# Patient Record
Sex: Female | Born: 1975 | Race: Black or African American | Hispanic: No | Marital: Single | Smoking: Current every day smoker
Health system: Southern US, Community
[De-identification: ages and names within clinical notes are randomized; demographics above are authoritative.]

## PROBLEM LIST (undated history)

## (undated) DIAGNOSIS — F410 Panic disorder [episodic paroxysmal anxiety] without agoraphobia: Secondary | ICD-10-CM

## (undated) DIAGNOSIS — I10 Essential (primary) hypertension: Secondary | ICD-10-CM

## (undated) DIAGNOSIS — F419 Anxiety disorder, unspecified: Secondary | ICD-10-CM

---

## 2007-08-26 ENCOUNTER — Inpatient Hospital Stay: Payer: Self-pay | Admitting: Psychiatry

## 2009-07-17 ENCOUNTER — Ambulatory Visit: Payer: Self-pay | Admitting: Internal Medicine

## 2009-07-18 ENCOUNTER — Ambulatory Visit: Payer: Self-pay | Admitting: Internal Medicine

## 2009-08-17 ENCOUNTER — Ambulatory Visit: Payer: Self-pay | Admitting: Internal Medicine

## 2010-04-04 ENCOUNTER — Emergency Department: Payer: Self-pay | Admitting: Emergency Medicine

## 2010-12-22 ENCOUNTER — Emergency Department: Payer: Self-pay | Admitting: Emergency Medicine

## 2012-12-27 ENCOUNTER — Inpatient Hospital Stay: Payer: Self-pay | Admitting: Psychiatry

## 2012-12-27 LAB — DRUG SCREEN, URINE
Amphetamines, Ur Screen: NEGATIVE (ref ?–1000)
Barbiturates, Ur Screen: NEGATIVE (ref ?–200)
Benzodiazepine, Ur Scrn: NEGATIVE (ref ?–200)
Cocaine Metabolite,Ur ~~LOC~~: NEGATIVE (ref ?–300)
MDMA (Ecstasy)Ur Screen: NEGATIVE (ref ?–500)
Opiate, Ur Screen: NEGATIVE (ref ?–300)
Tricyclic, Ur Screen: POSITIVE (ref ?–1000)

## 2012-12-27 LAB — CBC
HGB: 11.9 g/dL — ABNORMAL LOW (ref 12.0–16.0)
MCHC: 33.5 g/dL (ref 32.0–36.0)
Platelet: 200 10*3/uL (ref 150–440)
RBC: 3.85 10*6/uL (ref 3.80–5.20)
WBC: 6 10*3/uL (ref 3.6–11.0)

## 2012-12-27 LAB — TSH: Thyroid Stimulating Horm: 2.2 u[IU]/mL

## 2012-12-27 LAB — COMPREHENSIVE METABOLIC PANEL
Albumin: 3.8 g/dL (ref 3.4–5.0)
BUN: 15 mg/dL (ref 7–18)
Bilirubin,Total: 0.3 mg/dL (ref 0.2–1.0)
Calcium, Total: 8.8 mg/dL (ref 8.5–10.1)
Chloride: 109 mmol/L — ABNORMAL HIGH (ref 98–107)
Creatinine: 0.75 mg/dL (ref 0.60–1.30)
EGFR (Non-African Amer.): >60
Potassium: 3.6 mmol/L (ref 3.5–5.1)
SGPT (ALT): 17 U/L (ref 12–78)
Total Protein: 7.5 g/dL (ref 6.4–8.2)

## 2012-12-27 LAB — ETHANOL
Ethanol %: <0.003 % (ref 0.000–0.080)
Ethanol: <3 mg/dL

## 2012-12-27 LAB — ACETAMINOPHEN LEVEL: Acetaminophen: <2 ug/mL

## 2012-12-27 LAB — SALICYLATE LEVEL: Salicylates, Serum: 2 mg/dL

## 2012-12-28 LAB — BEHAVIORAL MEDICINE 1 PANEL
Albumin: 3.9 g/dL (ref 3.4–5.0)
Alkaline Phosphatase: 56 U/L (ref 50–136)
Anion Gap: 7 (ref 7–16)
BUN: 12 mg/dL (ref 7–18)
Basophil #: 0.1 10*3/uL (ref 0.0–0.1)
Basophil %: 1.1 %
Bilirubin,Total: 0.2 mg/dL (ref 0.2–1.0)
Calcium, Total: 9 mg/dL (ref 8.5–10.1)
Chloride: 105 mmol/L (ref 98–107)
Co2: 23 mmol/L (ref 21–32)
Creatinine: 0.86 mg/dL (ref 0.60–1.30)
Eosinophil #: 0.3 10*3/uL (ref 0.0–0.7)
Eosinophil %: 4.3 %
Glucose: 116 mg/dL — ABNORMAL HIGH (ref 65–99)
HCT: 37.8 % (ref 35.0–47.0)
HGB: 12.6 g/dL (ref 12.0–16.0)
Lymphocyte #: 3 10*3/uL (ref 1.0–3.6)
Lymphocyte %: 51.5 %
MCH: 30.5 pg (ref 26.0–34.0)
MCHC: 33.3 g/dL (ref 32.0–36.0)
MCV: 91 fL (ref 80–100)
Monocyte #: 0.4 x10 3/mm (ref 0.2–0.9)
Monocyte %: 6.4 %
Neutrophil #: 2.1 10*3/uL (ref 1.4–6.5)
Neutrophil %: 36.7 %
Osmolality: 271 (ref 275–301)
Platelet: 219 10*3/uL (ref 150–440)
Potassium: 3.8 mmol/L (ref 3.5–5.1)
RBC: 4.13 10*6/uL (ref 3.80–5.20)
RDW: 14.4 % (ref 11.5–14.5)
SGOT(AST): 16 U/L (ref 15–37)
SGPT (ALT): 23 U/L (ref 12–78)
Sodium: 135 mmol/L — ABNORMAL LOW (ref 136–145)
Thyroid Stimulating Horm: 5.47 u[IU]/mL — ABNORMAL HIGH
Total Protein: 8.2 g/dL (ref 6.4–8.2)
WBC: 5.9 10*3/uL (ref 3.6–11.0)

## 2012-12-30 LAB — URINALYSIS, COMPLETE
Glucose,UR: NEGATIVE mg/dL (ref 0–75)
Ketone: NEGATIVE
Nitrite: NEGATIVE
Protein: 100
RBC,UR: 2808 /HPF (ref 0–5)
Specific Gravity: 1.023 (ref 1.003–1.030)

## 2012-12-31 LAB — URINALYSIS, COMPLETE
Bacteria: NONE SEEN
Bilirubin,UR: NEGATIVE
Glucose,UR: NEGATIVE mg/dL (ref 0–75)
Ketone: NEGATIVE
Nitrite: NEGATIVE
Ph: 7 (ref 4.5–8.0)
Specific Gravity: 1.027 (ref 1.003–1.030)
Squamous Epithelial: 13

## 2013-05-09 ENCOUNTER — Encounter (HOSPITAL_COMMUNITY): Payer: Self-pay | Admitting: Emergency Medicine

## 2013-05-09 ENCOUNTER — Emergency Department (HOSPITAL_COMMUNITY)
Admission: EM | Admit: 2013-05-09 | Discharge: 2013-05-10 | Disposition: A | Discharge status: Home / Self Care | Payer: Medicaid Other | Attending: Emergency Medicine | Admitting: Emergency Medicine

## 2013-05-09 DIAGNOSIS — IMO0002 Reserved for concepts with insufficient information to code with codable children: Secondary | ICD-10-CM

## 2013-05-09 DIAGNOSIS — S3994XA Unspecified injury of external genitals, initial encounter: Secondary | ICD-10-CM | POA: Insufficient documentation

## 2013-05-09 DIAGNOSIS — F17200 Nicotine dependence, unspecified, uncomplicated: Secondary | ICD-10-CM | POA: Insufficient documentation

## 2013-05-09 DIAGNOSIS — S39848A Other specified injuries of external genitals, initial encounter: Secondary | ICD-10-CM | POA: Insufficient documentation

## 2013-05-09 DIAGNOSIS — I10 Essential (primary) hypertension: Secondary | ICD-10-CM | POA: Insufficient documentation

## 2013-05-09 DIAGNOSIS — Z8659 Personal history of other mental and behavioral disorders: Secondary | ICD-10-CM | POA: Insufficient documentation

## 2013-05-09 DIAGNOSIS — Z880 Allergy status to penicillin: Secondary | ICD-10-CM | POA: Insufficient documentation

## 2013-05-09 DIAGNOSIS — Z3202 Encounter for pregnancy test, result negative: Secondary | ICD-10-CM | POA: Insufficient documentation

## 2013-05-09 DIAGNOSIS — T7421XA Adult sexual abuse, confirmed, initial encounter: Secondary | ICD-10-CM | POA: Insufficient documentation

## 2013-05-09 HISTORY — DX: Anxiety disorder, unspecified: F41.9

## 2013-05-09 HISTORY — DX: Panic disorder (episodic paroxysmal anxiety): F41.0

## 2013-05-09 HISTORY — DX: Essential (primary) hypertension: I10
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
## 2013-05-09 NOTE — ED Notes (Signed)
Patient requesting to use the restroom. RR2 commode cleaned with clorox wipes and allowed to dry. Bedpan placed in commode. Patient instructed to void and place toilet tissue in bedpan with urine. After patient voided, bathroom door closed and no entry allowed until specimen collected by SANE RN.

## 2013-05-09 NOTE — ED Notes (Signed)
SANE nurse Lindsey at bedside.  
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
## 2013-05-10 MED ORDER — PROMETHAZINE HCL 25 MG PO TABS
25.0000 mg | ORAL_TABLET | Freq: Once | ORAL | Status: AC
  Administered 2013-05-10: 75 mg via ORAL

## 2013-05-10 MED ORDER — AZITHROMYCIN 250 MG PO TABS: 1000.0000 mg | ORAL_TABLET | Freq: Once | ORAL | Status: DC

## 2013-05-10 MED ORDER — METRONIDAZOLE 500 MG PO TABS
2000.0000 mg | ORAL_TABLET | Freq: Once | ORAL | Status: AC
  Administered 2013-05-10: 2000 mg via ORAL

## 2013-05-10 MED ORDER — CEFIXIME 400 MG PO TABS
400.0000 mg | ORAL_TABLET | Freq: Once | ORAL | Status: AC
  Administered 2013-05-10: 400 mg via ORAL

## 2013-05-10 MED ORDER — LEVONORGESTREL 1.5 MG PO TABS
1.5000 mg | ORAL_TABLET | Freq: Once | ORAL | Status: AC
  Administered 2013-05-10: 1.5 mg via ORAL
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
## 2013-05-10 NOTE — ED Provider Notes (Signed)
Medical screening examination/treatment/procedure(s) were performed by non-physician practitioner and as supervising physician I was immediately available for consultation/collaboration.  Derwood Kaplan, MD 05/10/13 [phone]

## 2013-05-13 ENCOUNTER — Encounter: Payer: Self-pay | Admitting: *Deleted

## 2013-05-24 ENCOUNTER — Encounter: Payer: Medicaid Other | Admitting: Family Medicine

## 2014-01-20 ENCOUNTER — Inpatient Hospital Stay: Payer: Self-pay | Admitting: Psychiatry

## 2014-01-20 LAB — URINALYSIS, COMPLETE
BACTERIA: NONE SEEN
BLOOD: NEGATIVE
Bilirubin,UR: NEGATIVE
Glucose,UR: NEGATIVE mg/dL (ref 0–75)
Ketone: NEGATIVE
LEUKOCYTE ESTERASE: NEGATIVE
Nitrite: NEGATIVE
Ph: 5 (ref 4.5–8.0)
Protein: NEGATIVE
RBC,UR: <1 /HPF (ref 0–5)
Specific Gravity: 1.029 (ref 1.003–1.030)
Squamous Epithelial: 2

## 2014-01-20 LAB — CBC
HCT: 37.9 % (ref 35.0–47.0)
HGB: 12.3 g/dL (ref 12.0–16.0)
MCH: 30.2 pg (ref 26.0–34.0)
MCHC: 32.5 g/dL (ref 32.0–36.0)
MCV: 93 fL (ref 80–100)
PLATELETS: 234 10*3/uL (ref 150–440)
RBC: 4.07 10*6/uL (ref 3.80–5.20)
RDW: 14.8 % — ABNORMAL HIGH (ref 11.5–14.5)
WBC: 5.5 10*3/uL (ref 3.6–11.0)

## 2014-01-20 LAB — DRUG SCREEN, URINE
Amphetamines, Ur Screen: NEGATIVE (ref ?–1000)
Barbiturates, Ur Screen: NEGATIVE (ref ?–200)
Benzodiazepine, Ur Scrn: NEGATIVE (ref ?–200)
CANNABINOID 50 NG, UR ~~LOC~~: NEGATIVE (ref ?–50)
COCAINE METABOLITE, UR ~~LOC~~: NEGATIVE (ref ?–300)
MDMA (Ecstasy)Ur Screen: NEGATIVE (ref ?–500)
Methadone, Ur Screen: NEGATIVE (ref ?–300)
OPIATE, UR SCREEN: NEGATIVE (ref ?–300)
PHENCYCLIDINE (PCP) UR S: NEGATIVE (ref ?–25)
Tricyclic, Ur Screen: POSITIVE (ref ?–1000)

## 2014-01-20 LAB — COMPREHENSIVE METABOLIC PANEL
Albumin: 3.9 g/dL (ref 3.4–5.0)
Alkaline Phosphatase: 43 U/L — ABNORMAL LOW
Anion Gap: 8 (ref 7–16)
BILIRUBIN TOTAL: 0.3 mg/dL (ref 0.2–1.0)
BUN: 13 mg/dL (ref 7–18)
CREATININE: 0.71 mg/dL (ref 0.60–1.30)
Calcium, Total: 9.1 mg/dL (ref 8.5–10.1)
Chloride: 106 mmol/L (ref 98–107)
Co2: 23 mmol/L (ref 21–32)
EGFR (African American): >60
GLUCOSE: 88 mg/dL (ref 65–99)
OSMOLALITY: 273 (ref 275–301)
POTASSIUM: 3.6 mmol/L (ref 3.5–5.1)
SGOT(AST): 10 U/L — ABNORMAL LOW (ref 15–37)
SGPT (ALT): 15 U/L (ref 12–78)
Sodium: 137 mmol/L (ref 136–145)
TOTAL PROTEIN: 7.9 g/dL (ref 6.4–8.2)

## 2014-01-20 LAB — ACETAMINOPHEN LEVEL

## 2014-01-20 LAB — SALICYLATE LEVEL: Salicylates, Serum: 2.4 mg/dL

## 2014-01-20 LAB — ETHANOL

## 2014-07-31 ENCOUNTER — Emergency Department: Payer: Self-pay | Admitting: Emergency Medicine

## 2014-07-31 LAB — COMPREHENSIVE METABOLIC PANEL
Albumin: 4.3 g/dL (ref 3.4–5.0)
Alkaline Phosphatase: 42 U/L — ABNORMAL LOW
Anion Gap: 7 (ref 7–16)
BUN: 9 mg/dL (ref 7–18)
Bilirubin,Total: 0.3 mg/dL (ref 0.2–1.0)
CO2: 25 mmol/L (ref 21–32)
Calcium, Total: 9 mg/dL (ref 8.5–10.1)
Chloride: 103 mmol/L (ref 98–107)
Creatinine: 0.81 mg/dL (ref 0.60–1.30)
EGFR (African American): >60
GLUCOSE: 105 mg/dL — AB (ref 65–99)
Osmolality: 269 (ref 275–301)
Potassium: 3.8 mmol/L (ref 3.5–5.1)
SGOT(AST): 13 U/L — ABNORMAL LOW (ref 15–37)
SGPT (ALT): 21 U/L
SODIUM: 135 mmol/L — AB (ref 136–145)
Total Protein: 8.1 g/dL (ref 6.4–8.2)

## 2014-07-31 LAB — URINALYSIS, COMPLETE
BILIRUBIN, UR: NEGATIVE
Bacteria: NONE SEEN
Blood: NEGATIVE
GLUCOSE, UR: NEGATIVE mg/dL (ref 0–75)
Ketone: NEGATIVE
Leukocyte Esterase: NEGATIVE
NITRITE: NEGATIVE
Ph: 8 (ref 4.5–8.0)
Protein: NEGATIVE
RBC, UR: NONE SEEN /HPF (ref 0–5)
Specific Gravity: 1.009 (ref 1.003–1.030)

## 2014-07-31 LAB — DRUG SCREEN, URINE

## 2014-07-31 LAB — PREGNANCY, URINE: PREGNANCY TEST, URINE: NEGATIVE m[IU]/mL

## 2014-07-31 LAB — CBC
HCT: 40.1 % (ref 35.0–47.0)
HGB: 12.8 g/dL (ref 12.0–16.0)
MCH: 30.7 pg (ref 26.0–34.0)
MCHC: 32 g/dL (ref 32.0–36.0)
MCV: 96 fL (ref 80–100)
Platelet: 251 10*3/uL (ref 150–440)
RBC: 4.18 10*6/uL (ref 3.80–5.20)
RDW: 15.4 % — ABNORMAL HIGH (ref 11.5–14.5)
WBC: 6 10*3/uL (ref 3.6–11.0)

## 2014-07-31 LAB — ACETAMINOPHEN LEVEL: Acetaminophen: <2 ug/mL

## 2014-07-31 LAB — ETHANOL: Ethanol: <3 mg/dL

## 2014-07-31 LAB — SALICYLATE LEVEL

## 2014-07-31 LAB — VALPROIC ACID LEVEL: VALPROIC ACID: 3 ug/mL — AB

## 2014-08-29 LAB — URINALYSIS, COMPLETE
BILIRUBIN, UR: NEGATIVE
BLOOD: NEGATIVE
GLUCOSE, UR: NEGATIVE mg/dL (ref 0–75)
Leukocyte Esterase: NEGATIVE
Nitrite: NEGATIVE
Ph: 5 (ref 4.5–8.0)
Protein: NEGATIVE
RBC,UR: 1 /HPF (ref 0–5)
SPECIFIC GRAVITY: 1.024 (ref 1.003–1.030)
Squamous Epithelial: 4

## 2014-08-29 LAB — CBC WITH DIFFERENTIAL/PLATELET
BASOS ABS: 0.1 10*3/uL (ref 0.0–0.1)
Basophil %: 1.2 %
EOS PCT: 0.5 %
Eosinophil #: 0 10*3/uL (ref 0.0–0.7)
HCT: 40.5 % (ref 35.0–47.0)
HGB: 12.9 g/dL (ref 12.0–16.0)
LYMPHS PCT: 39.8 %
Lymphocyte #: 2.3 10*3/uL (ref 1.0–3.6)
MCH: 30.6 pg (ref 26.0–34.0)
MCHC: 32 g/dL (ref 32.0–36.0)
MCV: 96 fL (ref 80–100)
Monocyte #: 0.4 x10 3/mm (ref 0.2–0.9)
Monocyte %: 6.8 %
NEUTROS ABS: 3 10*3/uL (ref 1.4–6.5)
Neutrophil %: 51.7 %
Platelet: 252 10*3/uL (ref 150–440)
RBC: 4.23 10*6/uL (ref 3.80–5.20)
RDW: 14.4 % (ref 11.5–14.5)
WBC: 5.8 10*3/uL (ref 3.6–11.0)

## 2014-08-29 LAB — COMPREHENSIVE METABOLIC PANEL
ALBUMIN: 4 g/dL (ref 3.4–5.0)
AST: 25 U/L (ref 15–37)
Alkaline Phosphatase: 47 U/L
Anion Gap: 8 (ref 7–16)
BUN: 14 mg/dL (ref 7–18)
Bilirubin,Total: 0.2 mg/dL (ref 0.2–1.0)
CALCIUM: 9.1 mg/dL (ref 8.5–10.1)
Chloride: 106 mmol/L (ref 98–107)
Co2: 27 mmol/L (ref 21–32)
Creatinine: 0.78 mg/dL (ref 0.60–1.30)
EGFR (Non-African Amer.): >60
GLUCOSE: 77 mg/dL (ref 65–99)
OSMOLALITY: 281 (ref 275–301)
Potassium: 4 mmol/L (ref 3.5–5.1)
SGPT (ALT): 27 U/L
Sodium: 141 mmol/L (ref 136–145)
Total Protein: 8 g/dL (ref 6.4–8.2)

## 2014-08-29 LAB — DRUG SCREEN, URINE

## 2014-08-29 LAB — VALPROIC ACID LEVEL: Valproic Acid: <3 ug/mL — ABNORMAL LOW

## 2014-08-29 LAB — TROPONIN I: Troponin-I: <0.02 ng/mL

## 2014-08-29 LAB — ACETAMINOPHEN LEVEL

## 2014-08-29 LAB — SALICYLATE LEVEL

## 2014-08-30 ENCOUNTER — Inpatient Hospital Stay: Payer: Self-pay | Admitting: Psychiatry

## 2014-08-30 LAB — ETHANOL: Ethanol: <3 mg/dL

## 2014-09-12 ENCOUNTER — Emergency Department: Payer: Self-pay | Admitting: Emergency Medicine

## 2014-09-12 LAB — CBC
HCT: 37.4 % (ref 35.0–47.0)
HGB: 12.1 g/dL (ref 12.0–16.0)
MCH: 30 pg (ref 26.0–34.0)
MCHC: 32.4 g/dL (ref 32.0–36.0)
MCV: 93 fL (ref 80–100)
Platelet: 206 10*3/uL (ref 150–440)
RBC: 4.04 10*6/uL (ref 3.80–5.20)
RDW: 13.8 % (ref 11.5–14.5)
WBC: 6.6 10*3/uL (ref 3.6–11.0)

## 2014-09-13 LAB — COMPREHENSIVE METABOLIC PANEL
ANION GAP: 7 (ref 7–16)
AST: 24 U/L (ref 15–37)
Albumin: 3.9 g/dL (ref 3.4–5.0)
Alkaline Phosphatase: 41 U/L — ABNORMAL LOW (ref 46–116)
BUN: 10 mg/dL (ref 7–18)
Bilirubin,Total: 0.3 mg/dL (ref 0.2–1.0)
CALCIUM: 8.8 mg/dL (ref 8.5–10.1)
CHLORIDE: 105 mmol/L (ref 98–107)
CO2: 27 mmol/L (ref 21–32)
Creatinine: 0.81 mg/dL (ref 0.60–1.30)
Glucose: 86 mg/dL (ref 65–99)
OSMOLALITY: 276 (ref 275–301)
POTASSIUM: 3.7 mmol/L (ref 3.5–5.1)
SGPT (ALT): 22 U/L (ref 14–63)
SODIUM: 139 mmol/L (ref 136–145)
Total Protein: 7.8 g/dL (ref 6.4–8.2)

## 2014-09-13 LAB — DRUG SCREEN, URINE
Amphetamines, Ur Screen: NEGATIVE
Barbiturates, Ur Screen: NEGATIVE
Benzodiazepine, Ur Scrn: NEGATIVE
Cannabinoid 50 Ng, Ur ~~LOC~~: NEGATIVE
Cocaine Metabolite,Ur ~~LOC~~: NEGATIVE
MDMA (Ecstasy)Ur Screen: NEGATIVE
Methadone, Ur Screen: NEGATIVE
Opiate, Ur Screen: NEGATIVE
Phencyclidine (PCP) Ur S: NEGATIVE
Tricyclic, Ur Screen: POSITIVE

## 2014-09-13 LAB — ACETAMINOPHEN LEVEL: Acetaminophen: <2 ug/mL

## 2014-09-13 LAB — SALICYLATE LEVEL: Salicylates, Serum: <1.7 mg/dL

## 2014-09-13 LAB — ETHANOL: Ethanol: <3 mg/dL
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
## 2014-12-07 NOTE — Discharge Summary (Signed)
PATIENT NAME:  [name], [name] MR#:  [id number] DATE OF BIRTH:  1976-03-21  DATE OF ADMISSION:  12/27/2012 DATE OF DISCHARGE:  12/31/2012  HOSPITAL COURSE: See dictated history and physical for details of admission. This 39 year old woman with a history of chronic mental illness was admitted to the hospital with a worsening of mood and behavior in the context of alcohol abuse. The patient did not require detox. She was cooperative and pleasant on the ward. Some minor changes were made to her psychiatric medication, but overall she remained euthymic with a general improvement in her mood. She was cooperative on the unit and did not display any dangerous behavior. She denied suicidal ideation. She was able to engage appropriately in treatment on the unit. She was very interested in returning to her group home and continuing to follow up with [facility]. She was discharged on May 17th on Saturday when her group home could pick her up. She was agreeable to the plan for continued outpatient treatment in the community, including [facility] and [facility].   LABORATORY RESULTS: The chemistry panel showed a chloride slightly elevated at 109, otherwise unremarkable. Alcohol undetected. Hemoglobin slightly low at 11.9, otherwise normal CBC. TSH normal at 2.2. Drug screen positive for tricyclics, probably related to her medicine. Follow-up chemistries and CBC are nothing remarkable. Urinalysis, however, did show positive signs of an infection.   DISCHARGE MEDICATIONS: Trazodone 150 mg at night, Lexapro 30 mg per day, lovastatin 10 mg per day, Seroquel 300 mg, 2 of the tablets at night, iron sulfate 325 mg a day, omeprazole 40 mg a day.   MENTAL STATUS EXAM AT DISCHARGE: A neatly dressed and groomed woman, looks her stated age, cooperative with the interview. Eye contact good. Psychomotor activity normal. Speech somewhat slow but otherwise normal. Affect euthymic.  Mood stated as being fine. Denies suicidal or homicidal ideation. Shows improved judgment and insight. Denies any hallucinations. Probably low average intelligence.   DIAGNOSIS, PRINCIPAL AND PRIMARY:  AXIS I:  Schizophrenia, undifferentiated.    SECONDARY DIAGNOSES: AXIS I: Alcohol dependence.   AXIS II: Mild mental retardation.   AXIS III: Some scrapes to her forearm, self-inflicted but healing up.   AXIS IV: Severe chronic stress from burden of illness.   AXIS V: Functioning at time of discharge 60.   ____________________________ Audery AmelJohn T. Clapacs, MD jtc:cb D: 01/09/2013 22:14:59 ET T: 01/09/2013 22:38:55 ET JOB#: [id number]  cc: Audery AmelJohn T. Clapacs, MD, <Dictator> Audery AmelJOHN T CLAPACS MD ELECTRONICALLY SIGNED 01/10/2013 9:26

## 2014-12-07 NOTE — Consult Note (Signed)
PATIENT NAME:  [name], [name] MR#:  [id number] DATE OF BIRTH:  [birth year]  DATE OF ADMISSION:  12/26/2012  REQUESTING PHYSICIAN:  Bayard Males, MD  CONSULTING PHYSICIAN:  Ardeen Fillers. Garnetta Buddy, MD  REASON FOR ADMISSION:  "I let myself down."   HISTORY OF PRESENT ILLNESS: The patient is a 39 year old single African American female who is currently a resident of [facility] presented to the ED by the EMS, as she tried to cut herself and had self-inflicted abrasions and cuts on her left arm. The patient reported that she let herself down as she was feeling hopeless and wanted attention.  She reported that she never got to see her aunt before she was buried. She states that they were not taking her from her group home to see her aunt. She thinks about herself a lot. She reported that her aunt passed away a year ago and the group home was not allowing her to see her aunt. Reported that the group home was serving other clients and they were not allowing her to use the ice cream from the freezer as there was a lock on it.  She became very frustrated, down and depressed, and then she tried to cut herself on her left arm with a razor, and there were superficial abrasions noted on her left arm.   During my interview, the patient was noted to be sitting on the bed. She was down and depressed and her speech was soft. She reported that she does not like going over there and she is going to talk to her guardian about the same. Reported that she takes her medications but seems that they are not helping at this time. Reported that she feels very lonely over there. She reported that she has previous history of cutting in the past when she did the same thing approximately 8 days ago. The patient reported that she is unable to contract for safety at this time.   PAST PSYCHIATRIC HISTORY: The patient reported that she has previous history of psychiatric hospitalization when she was admitted to the behavioral health unit  in the past. She stated that she feels that the medications were not helping at that time also, and then she took an overdose of Xanax in a suicide attempt. She took the pills in the evening and woke up drowsy and was disoriented. At that time, she wanted to die and reported that her depression was precipitated by the number of stressors including relationship problems with her mother. Her mother was the payee at that time. She reported that she has long history of depression, PTSD, panic attacks.  SUBSTANCE ABUSE HISTORY:  The patient denied using any drugs or alcohol at this time.   PAST MEDICAL HISTORY: The patient does not have any acute medical issues except for GERD.   ALLERGIES:  PENICILLIN.   SOCIAL HISTORY:  The patient stated that she is currently living at the group home. She has 2 children, who are currently living with her grandmother. The patient was adopted, but then she grew up with other siblings. The patient reported that her mother handles her financial and business matters.   FAMILY HISTORY:  There is a strong family history of using drugs and alcohol.   MENTAL STATUS EXAMINATION:  The patient is a moderately built female who was sitting in the bed. She was calm and cooperative. There were abrasions noted on her left hand. Her speech was low in tone and volume. Mood was depressed and anxious.  Affect was congruent. Thought process was logical, goal-directed. Thought content was nondelusional. She reported having suicidal thoughts. She has some impulsive behavior. She was unable to contract for safety. Her insight and judgment very poor.  VITAL SIGNS:  Temperature 98.2, pulse 98, respirations 18, blood pressure 105/51.  LABORATORY DATA:  Glucose 86, BUN 15, creatinine 0.75, sodium 140, potassium 3.6, chloride 109, bicarbonate 27, anion gap 404, osmolality 280, calcium 8.8.  Blood alcohol level less than 3. Protein 7.5, albumin 3.8, bilirubin 0.3 alkaline phosphatase 50, AST 19, ALT  17, TSH 2.2.  Urine drug screen positive for tricyclic antidepressants. WBC 6, RBC 3.85, hemoglobin 11.9, hematocrit 35.3, platelet count 200, MCH 33.5.  REVIEW OF SYSTEMS:  CONSTITUTIONAL:  Denied any weight loss.  EYES:  No blurring of vision.  ENT:  No hearing loss. CHEST:  Denied having any chest pain or palpitations.  RESPIRATORY:  No cough or wheezing. NEUROLOGIC:  No musculoskeletal pain.   DIAGNOSTIC IMPRESSION: AXIS I:  Major depressive disorder, history of posttraumatic stress disorder.   TREATMENT PLAN: Pt will be admitted to inpt BH unit on IVC  The patient will be restarted back on her medications, including: 1.  Seroquel XR 200 mg p.o. at bedtime.  2.  Tegretol 200 mg in the morning.  3.  Trazodone at bedtime to help her sleep.  Treatment team will continue to follow and will adjust her medications. She will be monitored closely in the behavioral health unit.  Thank you for allowing me to participate in the care of this patient.     ____________________________ Ardeen FillersUzma S. Garnetta BuddyFaheem, MD usf:ce D: 12/27/2012 16:13:23 ET T: 12/27/2012 17:23:16 ET JOB#: [id number]  cc: Ardeen FillersUzma S. Garnetta BuddyFaheem, MD, <Dictator> Rhunette CroftUZMA S Jalexa Pifer MD ELECTRONICALLY SIGNED 12/29/2012 13:37

## 2014-12-08 NOTE — Consult Note (Signed)
Salinas NAME:  Salinas, [name] DATE OF BIRTH:  29-Dec-1975  DATE OF CONSULTATION:  07/31/2014  REFERRING PHYSICIAN:   CONSULTING PHYSICIAN:  Audery Amel, MD  IDENTIFYING INFORMATION AND REASON FOR CONSULT:  A 39 year old woman with a history of schizophrenia, presented voluntarily.   CHIEF COMPLAINT: "I can't stand [facility]."   HISTORY OF PRESENT ILLNESS: Information obtained from [name] and [facility] chart. [name] describes some escalating frustration she has been having with Krystal way they treat her at [facility]. She describes multiple minor complaints of people at [facility] speaking to her rudely and not treating her respectfully. She says that other people at [facility] stare at her out of their psychosis. She feels uncomfortable there. On top of this it sounds like a man she had been seeing broke up with her last night which upset her. Also she had had a day visit to a relative's house and had tried to stay there overnight, which was not Krystal agreement and she got frustrated with that. [name] tells me that her mood has been bad for a while. Denies any sleep or appetite problems. She does not describe any clear auditory hallucinations. She is a little unreliable on this point, initially saying she did not hear voices at all, later saying that she heard voices telling her to kill herself, and then later again readmitting that she did not hear active hallucinations. She denies that she has any intention to harm or try to kill herself. She has a plan laid out for Krystal future and positive goals for herself. She has long been frustrated with her living situation. She says that she is compliant with therapy and medication and is not abusing drugs or alcohol although she then says that she did drink a beer yesterday.    PAST PSYCHIATRIC HISTORY: Long history of chronic mental illness probably schizophrenia. [name] has been in a hospital several times in  Krystal past. She does have a history of self-mutilation when she is upset in Krystal past. She is currently being maintained on medication and is treated at [facility].  She has had several hospitalizations in Krystal past.   PAST MEDICAL HISTORY:  Has gastric reflux disease, otherwise no significant ongoing medical problems.   SOCIAL HISTORY: [name] has a legal guardian. She has several relatives that she thinks of as being her mother or close relatives of hers, but evidently it has not been seen as practical or safe for her to stay with them. She is currently residing in a group home. She has 4 children, but does not have any custody or contact with any of them.   FAMILY HISTORY: Does not describe any family history of mental illness.   SUBSTANCE ABUSE HISTORY:  [name] says that she rarely drinks and does not abuse any recreational drugs.   CURRENT MEDICATIONS: Seroquel 600 mg at night, omeprazole 40 mg a day, trazodone 150 mg at night, Lexapro 20 mg once a day, Depakote 500 mg at night.   ALLERGIES: PENICILLIN.   REVIEW OF SYSTEMS: Frustrated. Irritated. No suicidal ideation. No hallucinations, no homicidal ideation. No specific physical complaints.   MENTAL STATUS EXAMINATION: Slightly disheveled woman, looks her stated age, cooperative with Krystal interview. Appropriate and polite. Makes good eye contact. Normal psychomotor activity. Speech normal rate, tone, and volume. Affect is somewhat constricted and dysphoric. Mood is stated as being not so good. Thoughts are generally  lucid although simple and concrete. Did not make any obviously delusional statements. Denies auditory or visual hallucinations currently. Denies suicidal or homicidal plan or intent. Could remember 3 out of 3 objects immediately, 2 out of 3 at 3 minutes. She is alert and oriented x 4. Judgment and insight chronically impaired as shown by this kind of behavior.   LABORATORY RESULTS: Drug screen is all negative.  Depakote negative. Alcohol negative. Alkaline phosphatase low at 42.  Sodium was low at (Dictation Anomaly)<< MISSING TEXT>> Nothing else remarkable. CBC all normal. Urinalysis normal.   VITAL SIGNS: Blood pressure is 128/80, respirations 18, pulse 113, temperature 98.2.   ASSESSMENT: A 39 year old woman with schizophrenia who is frustrated and unhappy with her group home. Came here voluntarily very explicitly to get away from her group home. No sign of acute dangerousness to self or others. No sign of active psychosis. [name] does not need inpatient level psychiatric care and has appropriate outpatient care and a safe place to live in place. Does not meet commitment criteria.   TREATMENT PLAN: I spent some time doing some supportive and behavioral counseling with her. Psychoeducation completed. [name] understands that she will be released from [facility] to her group home and that she needs to go through appropriate channels of talking with her guardian and care providers if she wants to make a change to her living situation. No change to medicine. Case discussed with [facility] doctor.   DIAGNOSIS PRINCIPAL AND PRIMARY:   AXIS I: Schizophrenia.   SECONDARY DIAGNOSES:   AXIS I: No further diagnosis.   AXIS II: No diagnosis.   AXIS III: Gastric reflux symptoms.    ____________________________ Audery AmelJohn T. Marly Schuld, MD jtc:bu D: 07/31/2014 17:08:15 ET T: 07/31/2014 18:07:58 ET JOB#: [id number]  cc: Audery AmelJohn T. Brittanny Levenhagen, MD, <Dictator> Audery AmelJOHN T Ramelo Oetken MD ELECTRONICALLY SIGNED 08/08/2014 0:39

## 2014-12-08 NOTE — Consult Note (Signed)
Brief Consult Note: Diagnosis: schizophrenia.   Patient was seen by consultant.   Consult note dictated.   Discussed with Attending MD.   Comments: Psychiatry: PAtient seen and chart reviewed and note dictated. Patient voluntarily here due to her frustration withh the group home. No sign of escalating psychosis or imminant danger to self. [facility]. Patient not neeeding inpatient care can be released back to home.  Electronic Signatures: Shaleta Ruacho, Jackquline DenmarkJohn T (MD)  (Signed 15-Dec-15 16:58)  Authored: Brief Consult Note   Last Updated: 15-Dec-15 16:58 by Audery Amellapacs, Saki Legore T (MD)
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
## 2014-12-16 NOTE — H&P (Signed)
PATIENT NAME:  [name], [name] MR#:  [id number] DATE OF BIRTH:  January 26, 1976  DATE OF ADMISSION:  08/30/2014  REFERRING PHYSICIAN: Emergency Room     ATTENDING PHYSICIAN: Kariann Wecker B. Jennet MaduroPucilowska, MD   IDENTIFYING DATA: [name] is a 39 year old female with history of schizophrenia.   CHIEF COMPLAINT: "Under stress."   HISTORY OF PRESENT ILLNESS:  [name] has a long history of  mental illness with multiple psychiatric hospitalizations. She has been hospitalized at [facility] twice in the past. She was seen in the Emergency Room here 1 month ago. Apparently following her visit in the Emergency Room, her group home gave her a 30 day notice.  Her time at the group home expired today and the patient found herself homeless.  She complains that since the middle of December she has been increasingly depressed with poor sleep, decreased appetite and anhedonia, feeling of guilt, hopelessness, worthlessness, poor energy and concentration, social isolation, crying spells, and thoughts of suicide. She came to the hospital with a plan to step in front of the truck.  She reports good treatment compliance but also suffered major losses.  In December her father passed away and she has not been able to get out of depressed mood. In addition, there was a conflict at the group home. She felt that she has been treated unfairly and unkindly, however, according to the group home, reports the patient consistently has been running away from the place, extending her planned visits beyond allowed time and not respecting group home rules. The patient does have a history of wondering away.  She has a family in the area, especially her mother, and in spite of guardians reservations about contact with the family she has been running away from group homes to stay in touch with her family. She also while depressed tends to think more of her children whose custody she lost and who are now with her sister. In  the past she endorsed psychotic symptoms but today she denies any paranoia, delusions or hallucinations.  In the past she used to drink heavily but has not been drinking recently. She denies symptoms suggestive of bipolar mania. She does not particularly complain of anxiety.   PAST PSYCHIATRIC HISTORY: Long history of mental illness with multiple different diagnoses. She denies suicide attempts in the past. There is a history of substance abuse, now in remission. She has been tried on multiple medications, but apparently the Seroquel works well for her.   FAMILY PSYCHIATRIC HISTORY: None reported.   PAST MEDICAL HISTORY: Anemia, joint pains.    ALLERGIES: PENICILLIN.   MEDICATIONS ON ADMISSION: Trazodone 200 mg at bedtime, Seroquel XR 600 mg at bedtime, pantoprazole 40 mg at bedtime, Flexeril 10 mg at bedtime, Lexapro 30 mg daily, ferrous sulfate 325 mg daily, lovastatin 10 mg at bedtime.   SOCIAL HISTORY: She is an incompetent adult. She used to live in a group home but found herself homeless now. It is not easy to understand how the patient could sign a final release document with the group home and find herself homeless if she is incompetent. I am not certain who the guardian is and whether or not the guardian has been looking into new placement. She is from our area and her family lives here. She lost custody of her children. She has disability and Medicaid.   REVIEW OF SYSTEMS:  CONSTITUTIONAL: No fevers or chills. No weight changes.  EYES: No double or blurred vision.  EARS, NOSE, AND  THROAT: No hearing loss.  RESPIRATORY: No shortness of breath or cough.  CARDIOVASCULAR: No chest pain or orthopnea.  GASTROINTESTINAL: No abdominal pain, nausea, vomiting, or diarrhea.  GENITOURINARY: No incontinence or frequency.  ENDOCRINE: No heat or cold intolerance.  LYMPHATIC: No anemia or easy bruising.  INTEGUMENTARY: No acne or rash.  MUSCULOSKELETAL: No muscle or joint pain.  NEUROLOGIC: No  tingling or weakness.  PSYCHIATRIC: See history of present illness for details.   PHYSICAL EXAMINATION:  VITAL SIGNS: Blood pressure 135/77, pulse 84, respirations 16, temperature 99.  GENERAL: This is a well-developed female in no acute distress.  HEENT: The pupils are equal, round, and reactive to light. Sclerae anicteric.  NECK: Supple. No thyromegaly.  LUNGS: Clear to auscultation. No dullness to percussion.  HEART: Regular rhythm and rate. No murmurs, rubs, or gallops.  ABDOMEN: Soft, nontender, nondistended. Positive bowel sounds.  MUSCULOSKELETAL: Normal muscle strength in all extremities.  SKIN: No rashes or bruises.  LYMPHATIC: No cervical adenopathy.  NEUROLOGIC: Cranial nerves II through XII are intact.   LABORATORY DATA: Chemistries are within normal limits. Blood alcohol level 0. LFTs within normal limits. Troponin less than 0.02. Depakote level less than 3. Urine toxicology screen negative for substances. CBC within normal limits. Urinalysis is not suggestive of urinary tract infection, serum acetaminophen and salicylates are low. EKG: Normal sinus rhythm, normal EKG.   MENTAL STATUS EXAMINATION ON ADMISSION: The patient is alert and oriented to person, place, time and situation. She is polite and cooperative but looked distraught. She is poorly groomed and she maintains limited eye contact. Her speech is of normal rhythm, rate and volume. Mood is depressed with flat affect.  Thought process is logical. Thought content: She endorses suicidal ideation with a plan to step in front of a car. There are no thoughts of hurting others. There are no delusions or paranoia, auditory or visual hallucinations. Her cognition is grossly intact. Registration, recall, short and long-term memory are intact. She is of average intelligence and fund of knowledge. Her insight and judgment is poor.   SUICIDE RISK ASSESSMENT ON ADMISSION: This is a patient with long history of mental illness, depression,  psychosis but also self-injurious behaviors and substance abuse in the past who came to the hospital depressed and suicidal in the context of major loss and difficulties at the group home leading to homelessness.    INITIAL DIAGNOSES:  AXIS I: Schizophrenia, alcohol dependence, in full remission.  AXIS II: Deferred.  AXIS III: Dyslipidemia.   PLAN: The patient was admitted to [facility] for safety, stabilization and medication management.  1.  Suicidal ideation: The patient is able to contract for safety.  2.  Mood and psychosis: We will continue Seroquel XR 600 mg at night for psychosis and escitalopram 30 mg for depression.  3.  Insomnia: We will continue trazodone 200 mg for sleep.  4.  Dyslipidemia: We will continue cholesterol-lowering drugs.  5.  Social: We will contact her guardian to get guardian's involvement in placement of this unfortunate patient.  6.  Disposition: To be established.   ____________________________ Ellin Goodie. Jennet Maduro, MD jbp:AT D: 08/30/2014 23:13:35 ET T: 08/31/2014 00:20:45 ET JOB#: [id number]  cc: Shiree Altemus B. Jennet Maduro, MD, <Dictator> Shari Prows MD ELECTRONICALLY SIGNED 09/30/2014 21:38

## 2014-12-16 NOTE — Consult Note (Signed)
PATIENT NAME:  [name], [name] DATE OF BIRTH:  31-Jul-1976  DATE OF CONSULTATION:  08/30/2014  REFERRING PHYSICIAN:   CONSULTING PHYSICIAN:  Audery AmelJohn T. Wilmont Olund, MD  IDENTIFYING INFORMATION AND REASON FOR CONSULTATION: A 39 year old woman with a history of behavior problems, various diagnoses, depression versus schizophrenia. Currently in the hospital with suicidal ideation.   CHIEF COMPLAINT: "I'm not doing well."   HISTORY OF PRESENT ILLNESS: Information obtained from the patient and the chart. Once again, the patient has been engaging in the behaviors of running away from the group home to go to her mother's home without permission. Her mood has been feeling angry and sad. Sleep is okay when she takes her medicine. She has voiced some suicidal thoughts about running out in traffic or cutting herself. Denies that she is having auditory or visual hallucinations. Feels frustrated and confused. She has been compliant with her medicine, she says, and she is not drinking anymore. She signed a form, according to her, yesterday discharging her from her group home, but has no idea where to go.   PAST PSYCHIATRIC HISTORY: Long history of mental health problems, variously diagnosed as depression or schizophrenia, but with a past history of borderline intellectual functioning as well. Has a history of cutting, as well as suicide attempts. History of running away from group situations and general noncompliance. Used to drink heavily, but seems to have quit recently.   SOCIAL HISTORY: Stays in touch with her mother, but she has a legal guardian and is really not supposed to be going over to her mother's house without permission. She has been residing in a group home  but apparently, her 30 days are up and she has signed herself out.   PAST MEDICAL HISTORY: Gastric reflux symptoms, muscle aches and pains, and anemia.   ALLERGIES: PENICILLIN.   SUBSTANCE ABUSE HISTORY: History of heavy drinking  in the past, but has given up drinking and does not usually abuse drugs.   FAMILY HISTORY: Unknown.   CURRENT MEDICATIONS: Quetiapine 600 mg at night, Lexapro 30 mg a day, iron 325 mg a day, Flexeril 10 mg at night, lovastatin 10 mg at night, trazodone 200 mg at night, guanfacine 1 mg twice a day.   REVIEW OF SYSTEMS: Depressed mood. Suicidal thoughts. Hopelessness. Sluggishness. No other physical complaints.   MENTAL STATUS EXAMINATION: Disheveled woman, looks her stated age, cooperative with the interview. Poor eye contact. Psychomotor activity: Sluggish. Speech: Decreased in amount and rate. Affect: Flat and dysphoric. Mood: Stated as bad. Thoughts are slow, but lucid. No loosening of associations or delusions. Denies auditory or visual hallucinations. Endorses suicidal ideation. No homicidal ideation. Can repeat 3 words immediately, remembers 2 out of 3 at 3 minutes. She is alert and oriented x 4. Judgment and insight: Chronically somewhat impaired. Intelligence: Low average to possibly below average.   LABORATORY RESULTS: Chemistry panel is all normal. Valproic acid level: Undetectable. Drug screen: Negative. Alcohol: Negative. Hematology panel: All normal. Urinalysis: Unremarkable.   VITAL SIGNS: Blood pressure in the Emergency Room is 110/80, respirations 16, pulse 82, temperature 98.   ASSESSMENT: A 39 year old woman with a history of depression versus schizophrenia and behavior problems, with a history of suicide attempts in the past, continues to endorse suicidal ideation, multiple major life stresses.   TREATMENT PLAN: Admit to psychiatry. Suicide precautions in place. Continue current medicine. Supportive counseling and review of plan with the patient, who agrees to the plan.   DIAGNOSIS, PRINCIPAL AND PRIMARY:  AXIS I: Major depression, recurrent, severe.   SECONDARY DIAGNOSES: AXIS I:  1. Rule out schizophrenia.  2. Alcohol abuse in sustained remission.  AXIS II: Developmental  disability, borderline intellectual functioning.  AXIS III: Gastric reflux symptoms.   ____________________________ Audery Amel, MD jtc:mw D: 08/30/2014 11:25:55 ET T: 08/30/2014 11:39:31 ET JOB#: [id number]  cc: Audery Amel, MD, <Dictator> Audery Amel MD ELECTRONICALLY SIGNED 09/26/2014 17:19

## 2014-12-16 NOTE — Consult Note (Signed)
PATIENT NAME:  [name], KITCH MR#:  [id number] DATE OF BIRTH:  1975-12-23  DATE OF CONSULTATION:  09/13/2014  REFERRING PHYSICIAN:   CONSULTING PHYSICIAN:  Audery Amel, MD  IDENTIFYING INFORMATION AND REASON FOR CONSULT: This is a 39 year old woman with a history of schizophrenia or schizoaffective disorder, who came to the Emergency Room feeling sad and depressed. Her complaint to me, "I can't stand seeing him."   HISTORY OF PRESENT ILLNESS: Information obtained from the patient and the chart. The patient was just discharged from the hospital 8 days ago and comes back to the hospital tearful and sad. Yesterday, she had an interaction with her ex-boyfriend at the day program at [place]. Apparently, he did not speak to her the way she would like and she got more sad and tearful. When she spoke to nursing last night, she said that she did not want to live anymore. She did not act out at all on this, did not try to harm herself, did not specify any plan to kill herself. The patient has been compliant with her medicine and has not been abusing drugs. She goes to the day program at [facility] and has regular medication management. She has some chronic stress from this relationship that broke up months ago with a guy that she still sees around intermittently. Has had some trouble adjusting to that.   PAST PSYCHIATRIC HISTORY: Long history of mental health problems, multiple psychiatric hospitalizations in the past. She was just discharged from our hospital a little over a week ago. There is no past history of suicide attempts.  She used to abuse substances, but has not been doing that in a long time. She does have a legal guardian. Has been compliant recently with her medicine.   FAMILY HISTORY: No known family history.   PAST MEDICAL HISTORY: Chronic anemia and some chronic arthritis, nonspecific.   SUBSTANCE ABUSE HISTORY: Used to abuse drugs, but it has been years. Does not abuse anymore. It does  not seem to be an active part of her problem.   SOCIAL HISTORY: Currently lives in a group home. As I said, she has a legal guardian. Has been frustrated with the current group home at times, but it seems to mostly revolve around the guy that she still occasionally sees and the program that she goes to for day treatment.   CURRENT MEDICATIONS: Omeprazole 40 mg once a day, iron sulfate 325 mg once a day, Seroquel XR 600 mg at night, lovastatin 10 mg at night, Lexapro 30 mg a day, trazodone 200 mg at night.   ALLERGIES: PENICILLIN.   REVIEW OF SYSTEMS:  Feeling sad. Currently denies any suicidal ideation, intent or plan.  Denies homicidal ideation. Denies hallucinations. No specific physical complaints.   MENTAL STATUS EXAMINATION: Reasonably well-groomed woman, looks her stated age, cooperative with the interview. Eye contact intermittent. Psychomotor activity normal. Speech is normal in rate, tone and volume. Affect is sad, tearful at times, but reactive. Mood is stated as being depressed. Thoughts are a little bit slow and simple, nothing bizarre. No evidence of delusional thinking. Denies paranoia.  Denies auditory or visual hallucinations. She denies any suicidal or homicidal ideation. She is alert and oriented x4. She can repeat 3 words immediately, remembers all 3 of them at 3 minutes. Judgment and insight appears to be improved. Intelligence probably average to low average.   LABORATORY DATA: Salicylates, acetaminophen and alcohol negative. Chemistry panel unremarkable. CBC unremarkable. Drug screen positive just for tricyclics, which  is probably from her Seroquel.   VITAL SIGNS: Most recent blood pressure 120/71, respirations 18, pulse 93, temperature 98.1.   ASSESSMENT: A 39 year old woman with a history of schizophrenia or schizoaffective disorder, who came back to the hospital, once again feeling sad having had a sad, romantic interaction yesterday. She is currently denying any suicidal  ideation. Has no past history of suicidal behavior. She is not showing signs of acute psychosis. She has appropriate safe living situation and appropriate outpatient treatment.   TREATMENT PLAN: I spent some time doing some supportive counseling with the patient. Encouraged her to move on from her past romantic relationship. Encouraged her to talk with her guardian and take it appropriately if she really wants to try and change group homes. The patient is agreeable to discharge at this point and no longer meets commitment criteria. She will be taken off commitment and can be discharged back to her group home. Continue current medicine.  Follow up with [facility].  Case discussed with Emergency Room doctor.   DIAGNOSIS, PRINCIPAL AND PRIMARY:  AXIS I: Schizophrenia.   SECONDARY DIAGNOSES:  AXIS I: No further.  AXIS II: Deferred.  AXIS III: Arthritis, chronic anemia, gastric reflux symptoms.       ____________________________ Audery AmelJohn T. Tarryn Bogdan, MD jtc:by D: 09/13/2014 14:00:57 ET T: 09/13/2014 14:12:16 ET JOB#: [id number]  cc: Audery AmelJohn T. Leone Mobley, MD, <Dictator> Audery AmelJOHN T Winfield Caba MD ELECTRONICALLY SIGNED 09/26/2014 17:22

## 2015-08-16 ENCOUNTER — Ambulatory Visit
Admission: RE | Admit: 2015-08-16 | Discharge: 2015-08-16 | Disposition: A | Discharge status: Home / Self Care | Payer: Medicaid Other | Source: Ambulatory Visit | Attending: Internal Medicine | Admitting: Internal Medicine

## 2015-08-16 ENCOUNTER — Other Ambulatory Visit: Payer: Self-pay | Admitting: Internal Medicine

## 2015-08-16 DIAGNOSIS — R52 Pain, unspecified: Secondary | ICD-10-CM

## 2015-08-16 DIAGNOSIS — M545 Low back pain: Secondary | ICD-10-CM | POA: Insufficient documentation

## 2015-08-28 ENCOUNTER — Other Ambulatory Visit: Payer: Self-pay | Admitting: Internal Medicine

## 2015-08-28 DIAGNOSIS — M545 Low back pain, unspecified: Secondary | ICD-10-CM

## 2015-09-18 ENCOUNTER — Ambulatory Visit: Payer: Medicaid Other

## 2016-05-19 ENCOUNTER — Other Ambulatory Visit: Payer: Self-pay | Admitting: Internal Medicine

## 2016-05-19 ENCOUNTER — Ambulatory Visit
Admission: RE | Admit: 2016-05-19 | Discharge: 2016-05-19 | Disposition: A | Discharge status: Home / Self Care | Payer: Medicaid Other | Source: Ambulatory Visit | Attending: Internal Medicine | Admitting: Internal Medicine

## 2016-05-19 DIAGNOSIS — M4317 Spondylolisthesis, lumbosacral region: Secondary | ICD-10-CM | POA: Insufficient documentation

## 2016-05-19 DIAGNOSIS — M545 Low back pain: Secondary | ICD-10-CM | POA: Diagnosis present

## 2016-05-19 DIAGNOSIS — M47897 Other spondylosis, lumbosacral region: Secondary | ICD-10-CM | POA: Insufficient documentation

## 2016-05-19 DIAGNOSIS — R52 Pain, unspecified: Secondary | ICD-10-CM

## 2017-12-18 IMAGING — CR DG LUMBAR SPINE COMPLETE 4+V
1 series · 6 of 6 positions shown · non-contrast
Comparison: None.

CLINICAL DATA: Low back pain radiating down the left leg for 2
weeks.

EXAM:
LUMBAR SPINE - COMPLETE 4+ VIEW

[Series 1: dg lumbar spine complete 4 +v · 0.14mm/px · 6 of 6 slices shown]
[im 1/6]
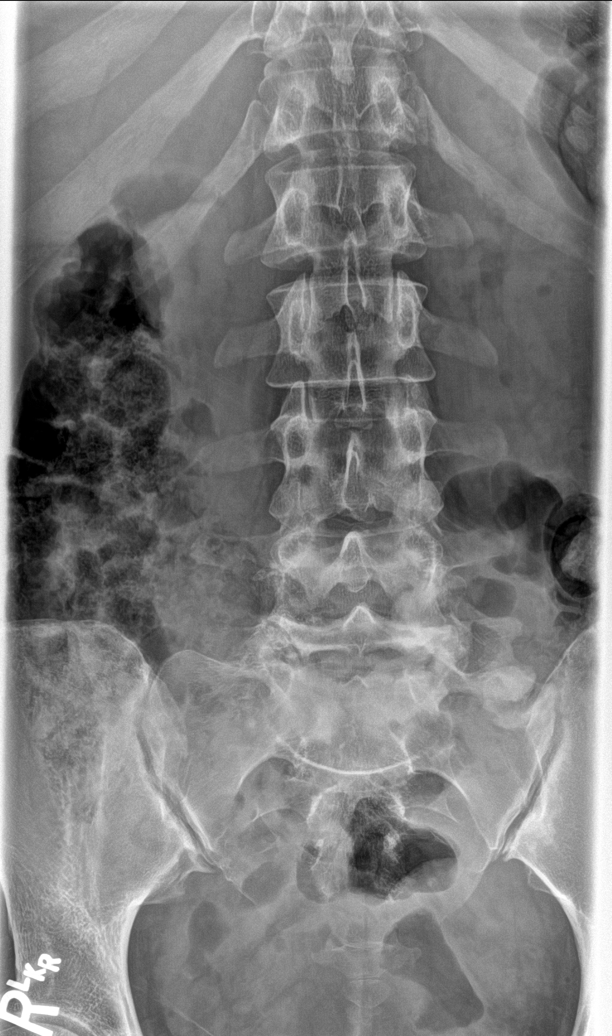
[im 2/6]
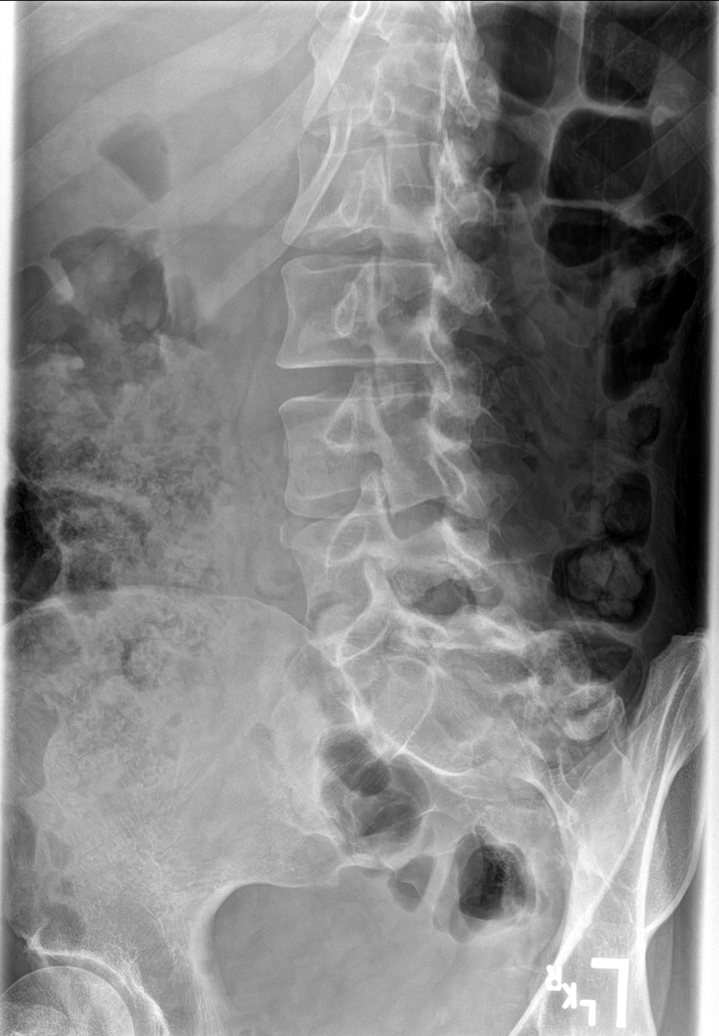
[im 3/6]
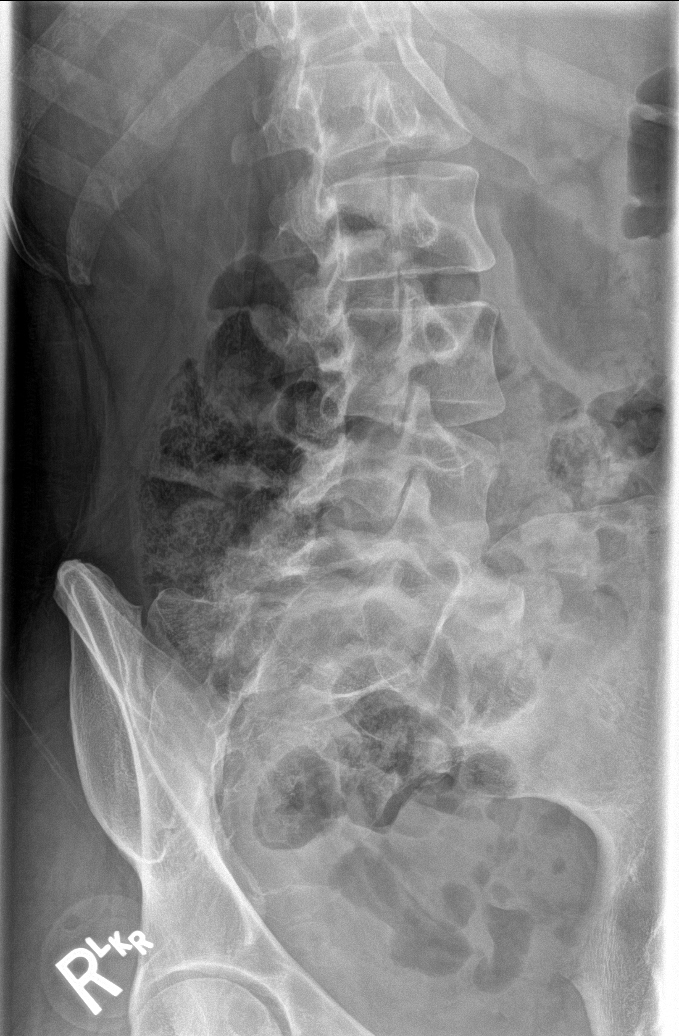
[im 4/6]
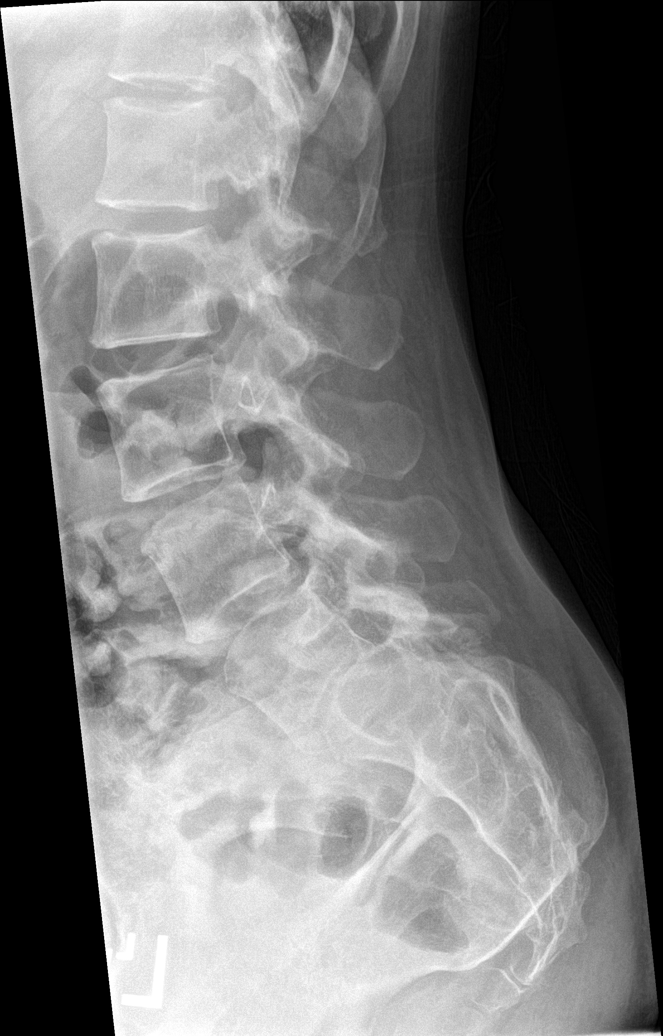
[im 5/6]
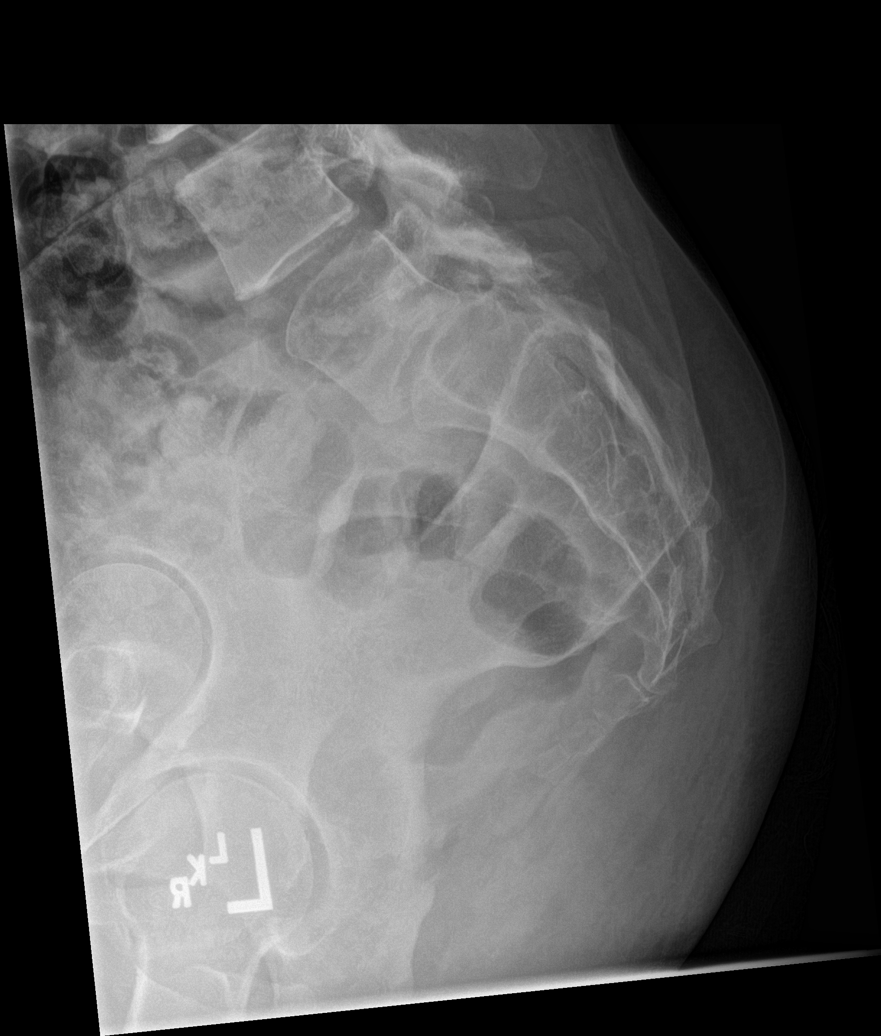
[im 6/6]
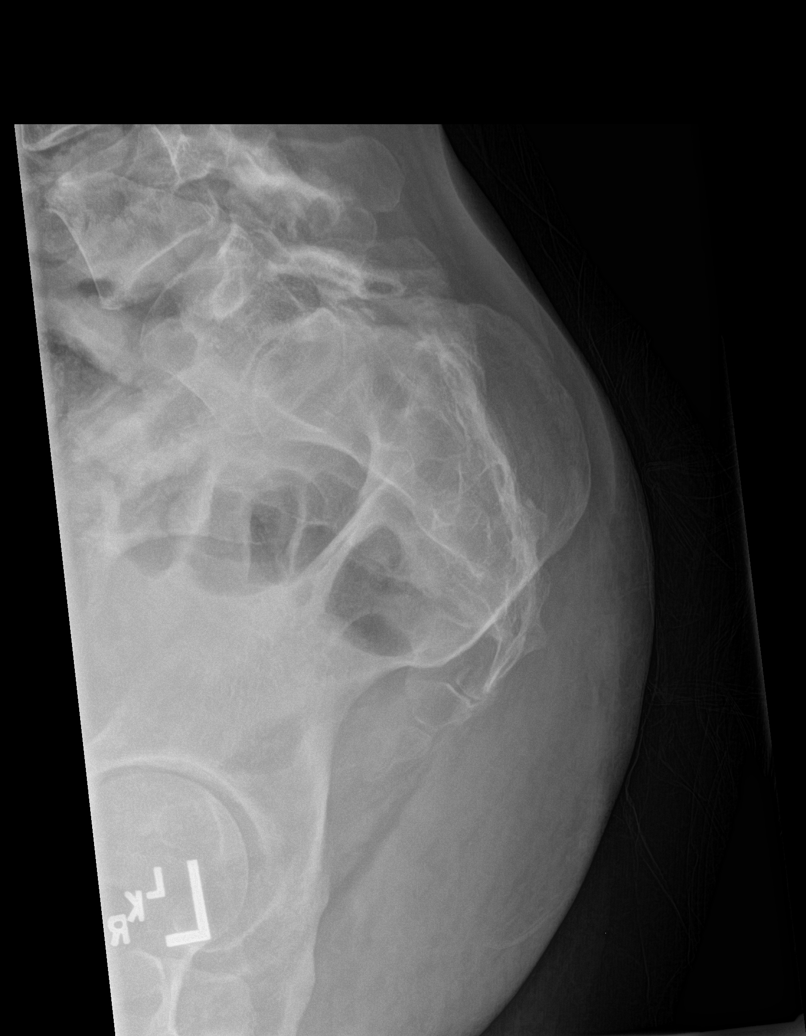

[6 of 6 positions shown; findings below may reference images not displayed]

FINDINGS: There may be transitional lumbosacral anatomy with either a
hypoplastic or narrowed L5-S1 disc space, however evaluation is
limited by obliquity on the lateral radiographs. No significant
listhesis is identified. Vertebral body heights are preserved
without evidence of compression fracture. Disc space heights are
preserved above the L5-S1 level. No pars defects or destructive
osseous lesions are seen. There is a moderate amount of proximal
colonic stool.
IMPRESSION: 1. No evidence of acute osseous abnormality.
2. Acquired versus developmental disc space height loss at L5-S1.

## 2018-09-21 IMAGING — CR DG LUMBAR SPINE COMPLETE 4+V
1 series · 5 of 5 positions shown · non-contrast
Comparison: August 16, 2015

CLINICAL DATA: Chronic lumbago

EXAM:
LUMBAR SPINE - COMPLETE 4+ VIEW

[Series 1: dg lumbar spine complete 4 +v · 0.14mm/px · 5 of 5 slices shown]
[im 1/5]
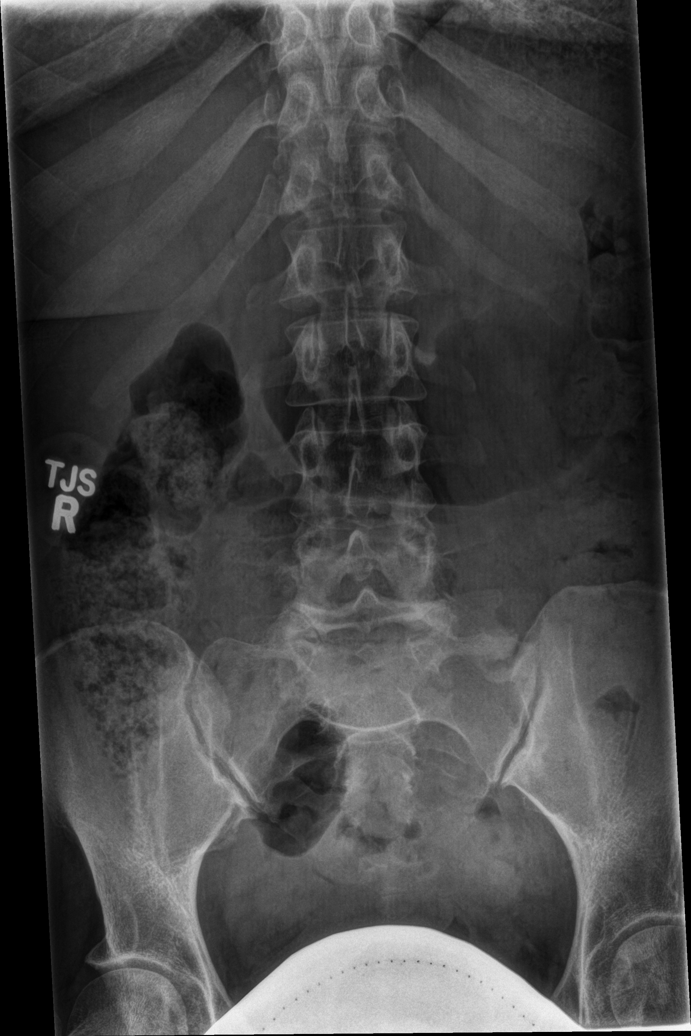
[im 2/5]
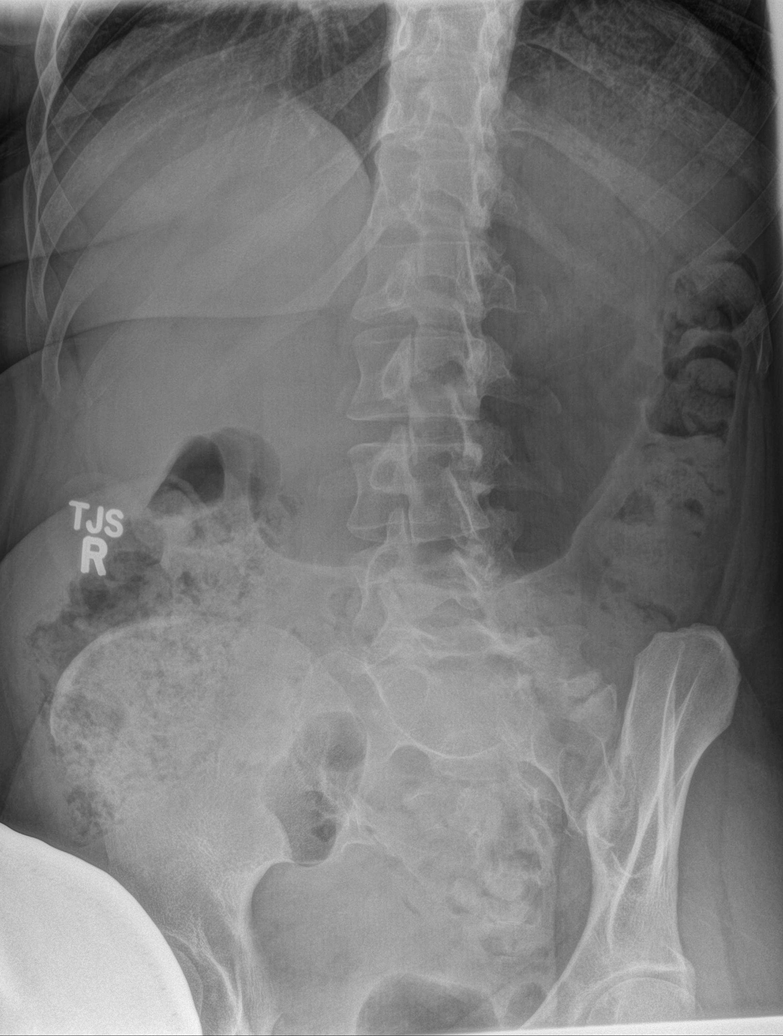
[im 3/5]
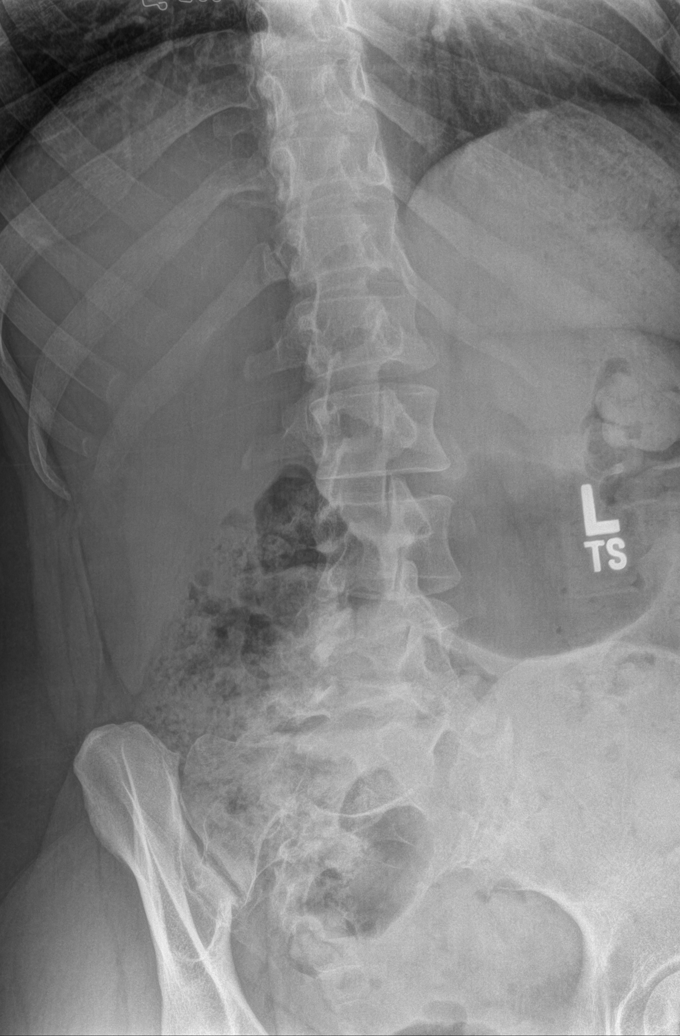
[im 4/5]
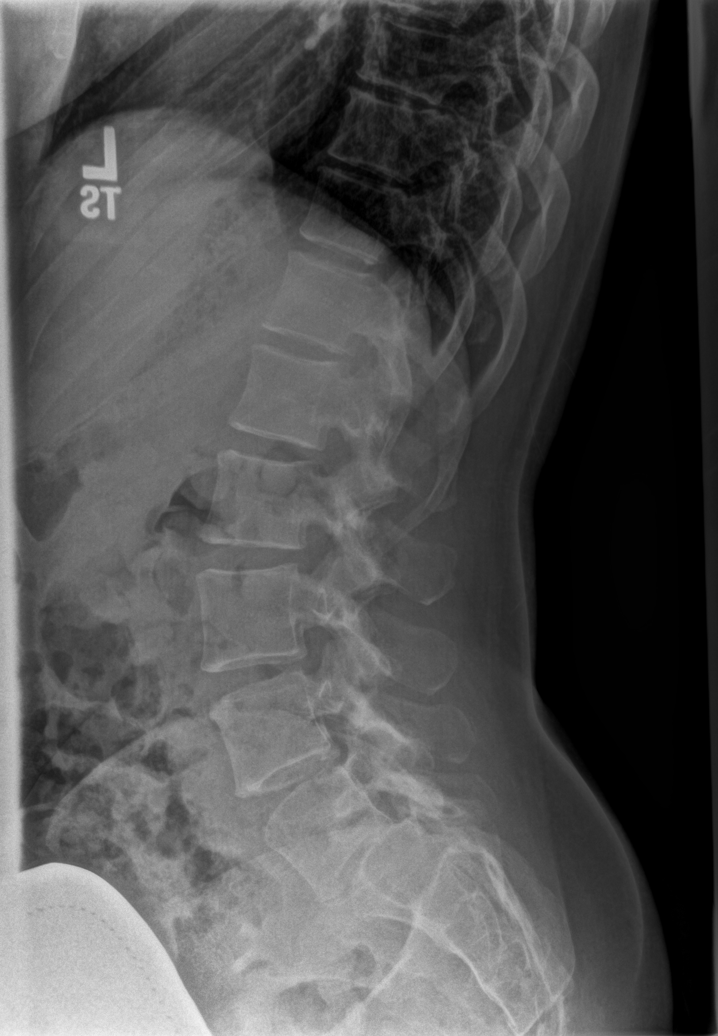
[im 5/5]
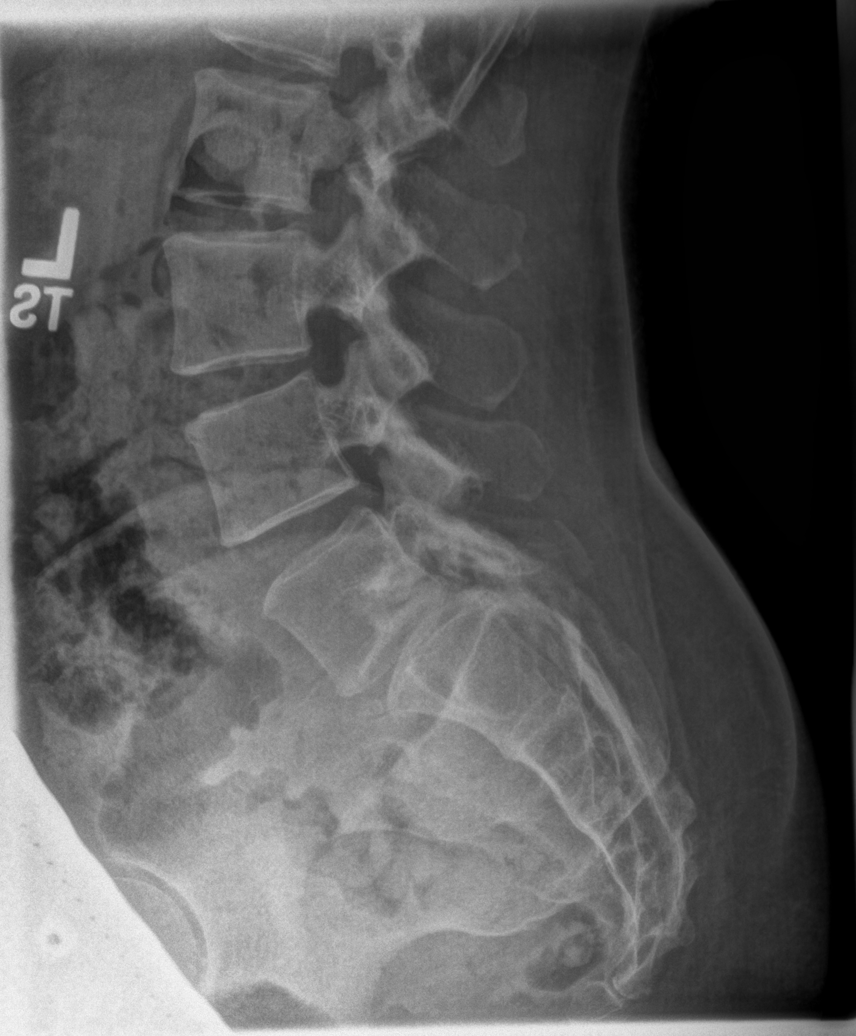

[5 of 5 positions shown; findings below may reference images not displayed]

FINDINGS: Frontal, lateral, spot lumbosacral lateral, and bilateral oblique
views were obtained. There are 5 non-rib-bearing lumbar type
vertebral bodies. There is no fracture. There is 5 mm of
anterolisthesis of L5 on S1. No other spondylolisthesis is evident.
There are apparent pars defects at L5 bilaterally. There is disc
space narrowing at L5-S1. The disc spaces appear normal. There is
mild facet osteoarthritic change at L5-S1 bilaterally.
IMPRESSION: Osteoarthritic change and spondylolisthesis at L5-S1. There appear
to be rather subtle pars defects at L5 bilaterally. No fracture.

## 2020-03-29 ENCOUNTER — Other Ambulatory Visit: Payer: Self-pay | Admitting: Internal Medicine

## 2020-03-29 ENCOUNTER — Ambulatory Visit: Payer: Medicaid Other

## 2020-03-29 ENCOUNTER — Other Ambulatory Visit (HOSPITAL_COMMUNITY): Payer: Self-pay | Admitting: Internal Medicine

## 2020-03-29 DIAGNOSIS — E785 Hyperlipidemia, unspecified: Secondary | ICD-10-CM

## 2020-03-29 DIAGNOSIS — R609 Edema, unspecified: Secondary | ICD-10-CM

## 2020-04-01 ENCOUNTER — Ambulatory Visit: Payer: Medicaid Other

## 2020-04-02 ENCOUNTER — Ambulatory Visit
Admission: RE | Admit: 2020-04-02 | Discharge: 2020-04-02 | Disposition: A | Discharge status: Home / Self Care | Payer: Medicaid Other | Source: Ambulatory Visit | Attending: Internal Medicine | Admitting: Internal Medicine

## 2020-04-02 ENCOUNTER — Other Ambulatory Visit: Payer: Self-pay

## 2020-04-02 DIAGNOSIS — E785 Hyperlipidemia, unspecified: Secondary | ICD-10-CM | POA: Diagnosis present

## 2020-04-02 DIAGNOSIS — R609 Edema, unspecified: Secondary | ICD-10-CM | POA: Diagnosis present

## 2021-04-30 ENCOUNTER — Other Ambulatory Visit: Payer: Self-pay

## 2021-04-30 ENCOUNTER — Encounter: Payer: Self-pay | Admitting: Obstetrics and Gynecology

## 2021-04-30 ENCOUNTER — Ambulatory Visit (INDEPENDENT_AMBULATORY_CARE_PROVIDER_SITE_OTHER): Payer: Medicaid Other | Admitting: Obstetrics and Gynecology

## 2021-04-30 ENCOUNTER — Other Ambulatory Visit (HOSPITAL_COMMUNITY)
Admission: RE | Admit: 2021-04-30 | Discharge: 2021-04-30 | Disposition: A | Discharge status: Home / Self Care | Payer: Medicaid Other | Source: Ambulatory Visit | Attending: Obstetrics and Gynecology | Admitting: Obstetrics and Gynecology

## 2021-04-30 VITALS — BP 120/76 | Ht 64.0 in | Wt 174.0 lb

## 2021-04-30 DIAGNOSIS — N898 Other specified noninflammatory disorders of vagina: Secondary | ICD-10-CM

## 2021-04-30 DIAGNOSIS — Z124 Encounter for screening for malignant neoplasm of cervix: Secondary | ICD-10-CM

## 2021-04-30 DIAGNOSIS — Z113 Encounter for screening for infections with a predominantly sexual mode of transmission: Secondary | ICD-10-CM | POA: Diagnosis not present

## 2021-05-01 NOTE — Progress Notes (Signed)
Obstetrics & Gynecology Office Visit   Chief Complaint:  Chief Complaint  Patient presents with   Vaginal Discharge    Referral for vaginal discharge with odor. RM 5    History of Present Illness: [name] is a 45 y.o. G2P0 who LMP was Patient's last menstrual period was 04/10/2021., presents today for a problem visit.   Patient complains of an abnormal vaginal discharge for several months.  Discharge described as: white. Vaginal symptoms include none.   Other associated symptoms: none.Menstrual pattern: She had been bleeding regularly. Contraception: tubal ligation.  She denies recent antibiotic exposure, denies changes in soaps, detergents coinciding with the onset of her symptoms.  She has not previously self treated or been under treatment by another provider for these symptoms.   Review of Systems: Review of Systems  Constitutional: Negative.   Genitourinary: Negative.   Skin: Negative.     Past Medical History:  Past Medical History:  Diagnosis Date   Anxiety    Hypertension    Panic disorder     Past Surgical History:  History reviewed. No pertinent surgical history.  Gynecologic History: Patient's last menstrual period was 04/10/2021.  Obstetric History: G2P0  Family History:  History reviewed. No pertinent family history.  Social History:  Social History   Socioeconomic History   Marital status: Single    Spouse name: Not on file   Number of children: Not on file   Years of education: Not on file   Highest education level: Not on file  Occupational History   Not on file  Tobacco Use   Smoking status: Every Day   Smokeless tobacco: Never  Vaping Use   Vaping Use: Some days   Substances: Nicotine, Flavoring  Substance and Sexual Activity   Alcohol use: No   Drug use: No   Sexual activity: Not Currently    Birth control/protection: Surgical    Comment: BTL  Other Topics Concern   Not on file  Social History Narrative   Not on  file   Social Determinants of Health   Financial Resource Strain: Not on file  Food Insecurity: Not on file  Transportation Needs: Not on file  Physical Activity: Not on file  Stress: Not on file  Social Connections: Not on file  Intimate Partner Violence: Not on file    Allergies:  Allergies  Allergen Reactions   Penicillins Nausea And Vomiting    Medications: Prior to Admission medications   Medication Sig Start Date End Date Taking? Authorizing Provider  ferrous sulfate 325 (65 FE) MG tablet Take 325 mg by mouth daily with breakfast.   Yes [provider]  QUEtiapine (SEROQUEL) 300 MG tablet Take 300 mg by mouth at bedtime.   Yes [provider]  QUEtiapine (SEROQUEL) 300 MG tablet Take 300 mg by mouth at bedtime.   Yes [provider]  traZODone (DESYREL) 50 MG tablet Take 50 mg by mouth at bedtime.   Yes [provider]    Physical Exam Vitals:  Vitals:   04/30/21 1613  BP: 120/76   Patient's last menstrual period was 04/10/2021.  General: NAD HEENT: normocephalic, anicteric Thyroid: no enlargement, no palpable nodules Pulmonary: No increased work of breathing Abdomen: soft, non-tender, non-distended.  Genitourinary:  External: Normal external female genitalia.  Normal urethral meatus, normal  Bartholin's and Skene's glands.    Vagina: Normal vaginal mucosa, no evidence of prolapse.    Cervix: Grossly normal in appearance, no bleeding  Uterus: Non-enlarged, mobile, normal contour.  No CMT  Adnexa: ovaries non-enlarged, no adnexal masses  Rectal: deferred  Lymphatic: no evidence of inguinal lymphadenopathy Extremities: no edema, erythema, or tenderness Neurologic: Grossly intact Psychiatric: mood appropriate, affect full  Female chaperone present for pelvic  portions of the physical exam  Assessment: 45 y.o. G2P0 with vaginal discharge  Plan: Problem List Items Addressed This Visit   None Visit Diagnoses      Screening for malignant neoplasm of cervix    -  Primary   Relevant Orders   Cytology - PAP   Routine screening for STI (sexually transmitted infection)       Relevant Orders   NuSwab Vaginitis Plus (VG+)   Vaginal discharge       Relevant Orders   NuSwab Vaginitis Plus (VG+)      1) Risk factors for bacterial vaginosis and candida infections discussed.  We discussed normal vaginal flora/microbiome.  Any factors that may alter the microbiome increase the risk of these opportunistic infections.  These include changes in pH, antibiotic exposures, diabetes, wet bathing suits etc.  We discussed that treatment is aimed at eradicating abnormal bacterial overgrowth and or yeast.  There may be some role for vaginal probiotics in restoring normal vaginal flora.   - pap collected - nuswab collected - treatment pending results  2) A total of 15 minutes were spent in face-to-face contact with the patient during this encounter with over half of that time devoted to counseling and coordination of care.   3) Return in about 1 year (around 04/30/2022) for annual.   Vena Austria, MD, [facility], [facility] 05/01/2021, 1:24 PM

## 2021-05-02 ENCOUNTER — Telehealth: Payer: Self-pay

## 2021-05-02 NOTE — Telephone Encounter (Signed)
[facility] referring for vaginal discharge. Called and left voicemail for patient to call back to be scheduled.

## 2021-05-03 LAB — NUSWAB VAGINITIS PLUS (VG+)
Candida albicans, NAA: NEGATIVE
Candida glabrata, NAA: NEGATIVE
Chlamydia trachomatis, NAA: NEGATIVE
Neisseria gonorrhoeae, NAA: NEGATIVE
Trich vag by NAA: NEGATIVE

## 2021-05-05 LAB — CYTOLOGY - PAP
Comment: NEGATIVE
Diagnosis: NEGATIVE
High risk HPV: NEGATIVE

## 2021-05-05 NOTE — Telephone Encounter (Signed)
Called and left voicemail for patient to call back to be scheduled. 

## 2021-11-19 ENCOUNTER — Other Ambulatory Visit: Payer: Medicaid Other

## 2021-11-21 ENCOUNTER — Other Ambulatory Visit: Payer: Self-pay | Admitting: Internal Medicine

## 2021-11-21 DIAGNOSIS — R0600 Dyspnea, unspecified: Secondary | ICD-10-CM

## 2021-11-25 ENCOUNTER — Telehealth: Payer: Self-pay | Admitting: *Deleted

## 2021-11-25 ENCOUNTER — Ambulatory Visit (INDEPENDENT_AMBULATORY_CARE_PROVIDER_SITE_OTHER): Payer: Medicaid Other

## 2021-11-25 DIAGNOSIS — R0600 Dyspnea, unspecified: Secondary | ICD-10-CM | POA: Diagnosis not present

## 2021-11-25 NOTE — Telephone Encounter (Signed)
Administrator states patient is able to walk on a treadmill for her test this morning. Confirmed appointment time and no further questions at this time.  ?

## 2021-11-26 LAB — ECHOCARDIOGRAM STRESS TEST: S' Lateral: 2.8 cm

## 2022-08-05 IMAGING — US US EXTREM LOW VENOUS*R*
1 series · 14 of 24 positions shown · non-contrast
Comparison: None.

CLINICAL DATA: 43-year-old female with right lower extremity edema
for 4 days.

EXAM:
RIGHT LOWER EXTREMITY VENOUS DOPPLER ULTRASOUND
TECHNIQUE: Gray-scale sonography with compression, as well as color and duplex
ultrasound, were performed to evaluate the deep venous system(s)
from the level of the common femoral vein through the popliteal and
proximal calf veins.

[Series 1: us extrem low venous*right* · 0.08mm/px · 14 of 36 slices shown]
[im 1/36]
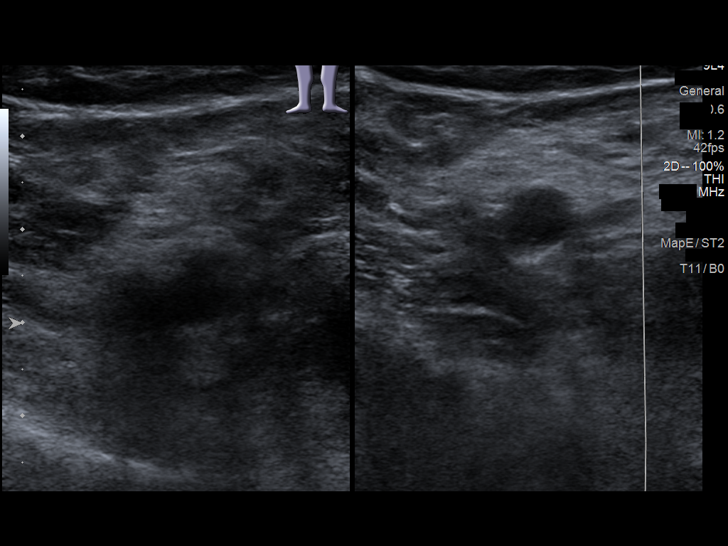
[im 4/36]
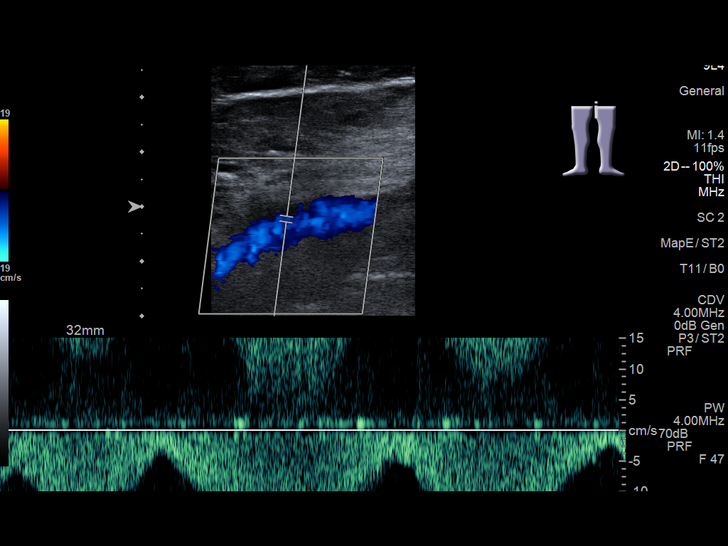
[im 7/36]
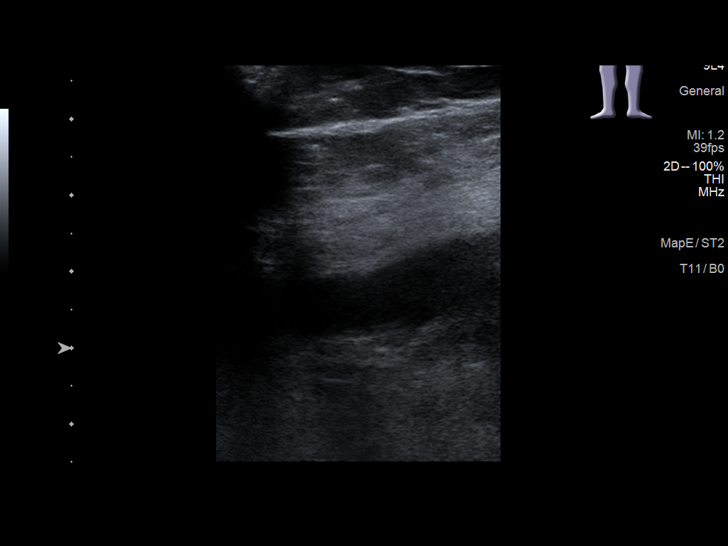
[im 10/36]
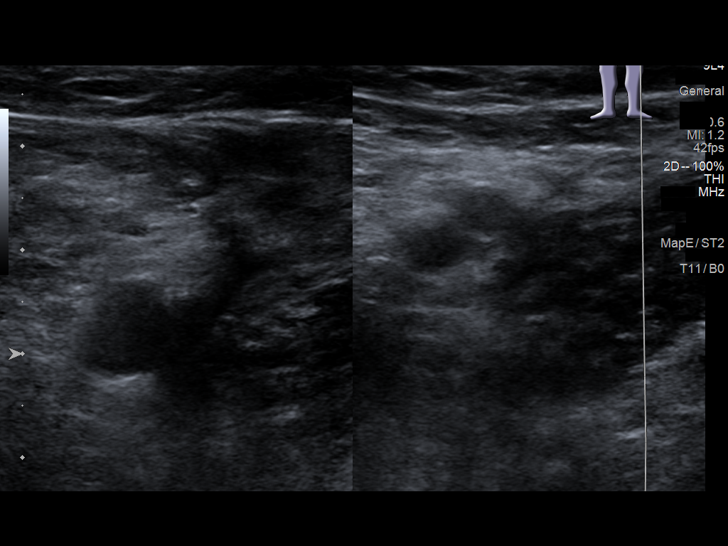
[im 11/36]
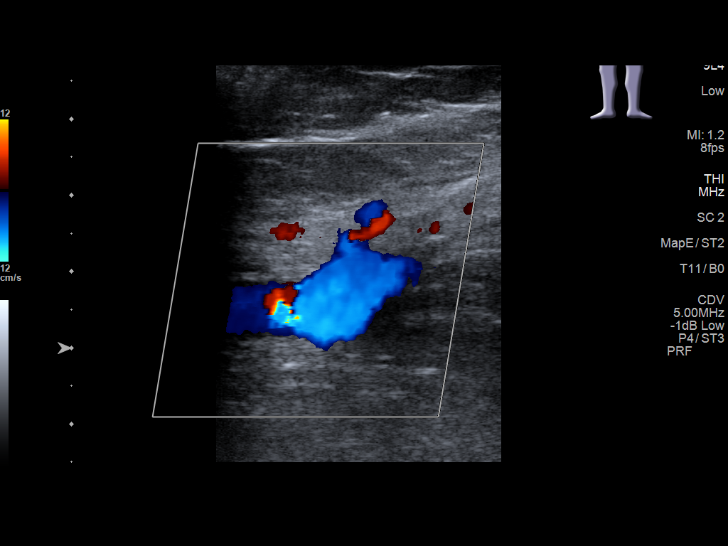
[im 14/36]
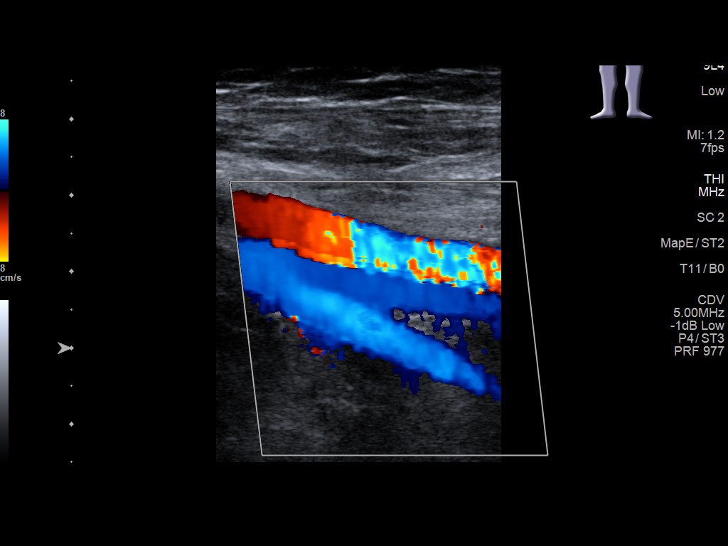
[im 17/36]
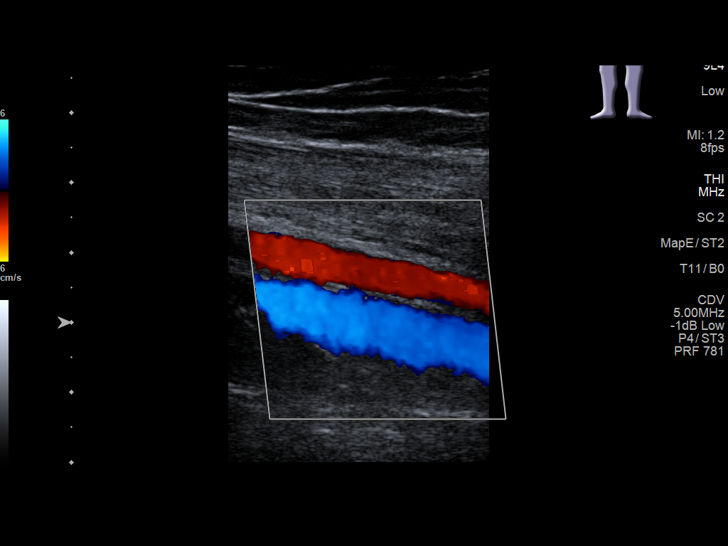
[im 19/36]
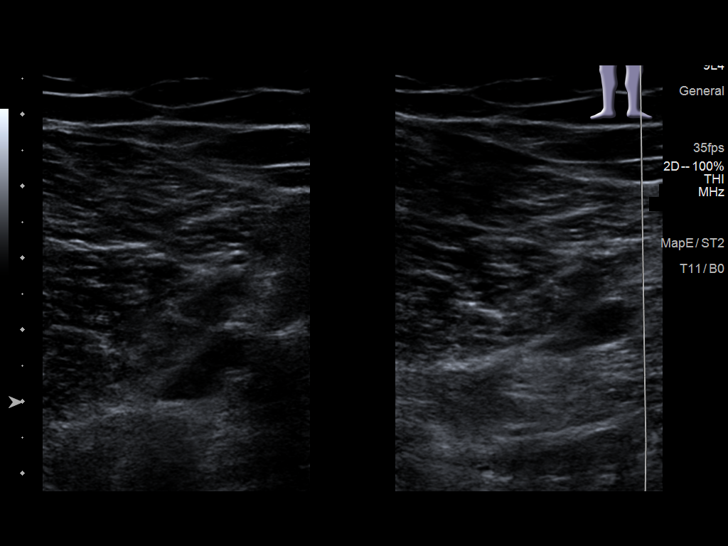
[im 22/36]
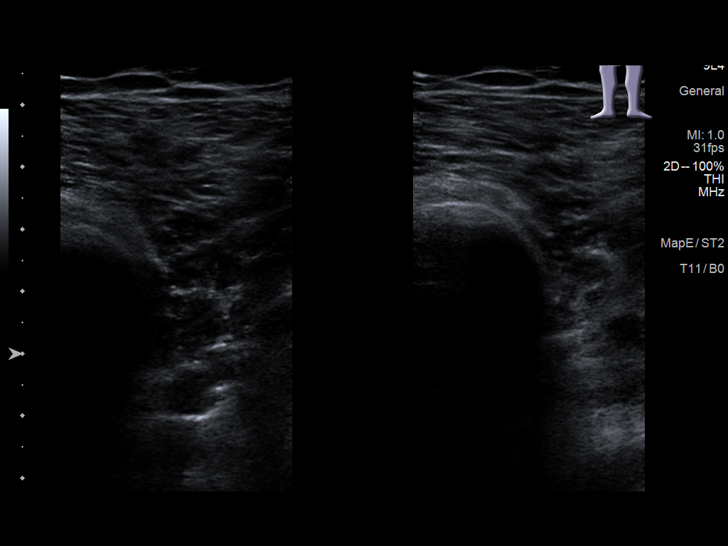
[im 25/36]
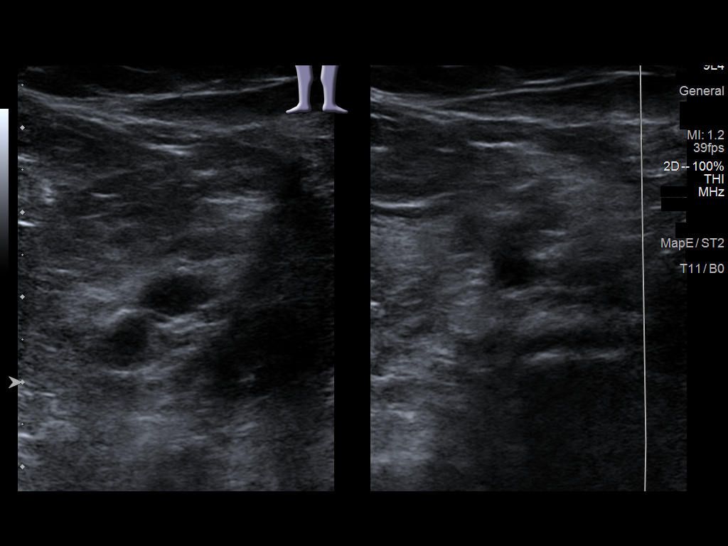
[im 28/36]
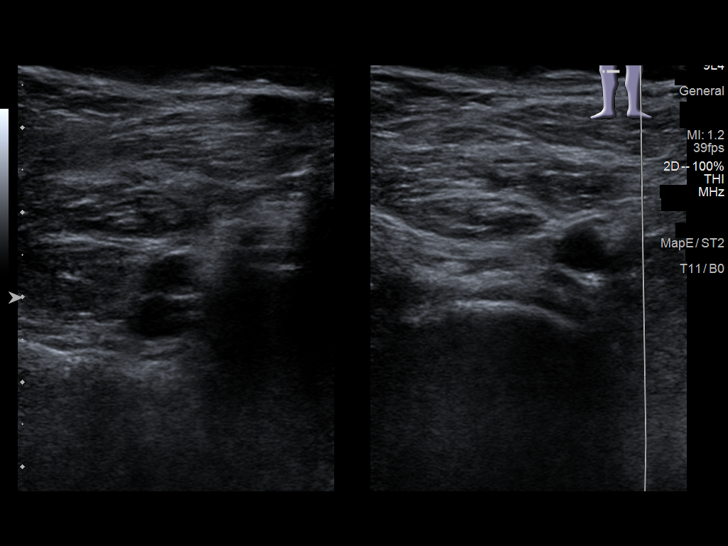
[im 29/36]
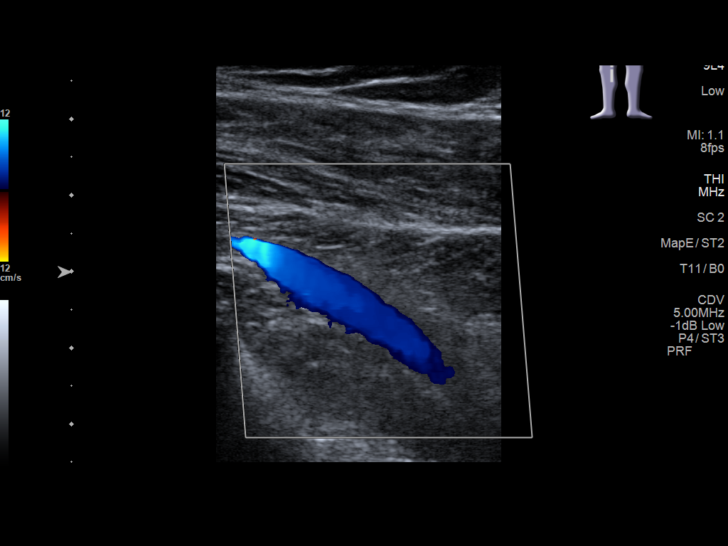
[im 32/36]
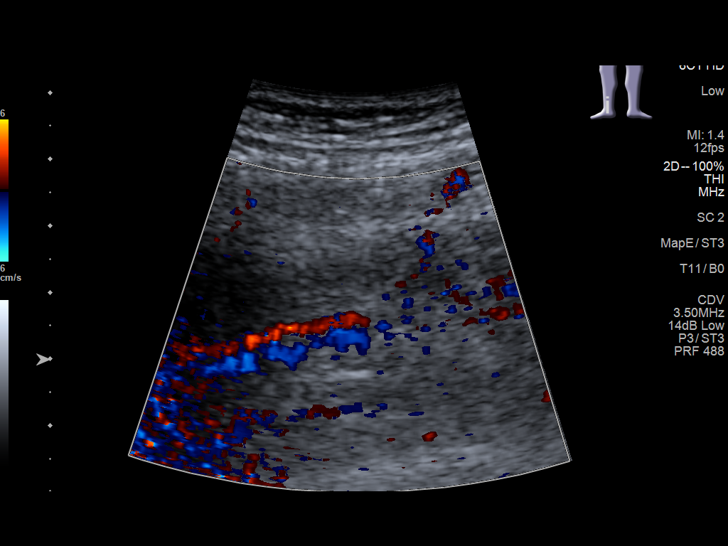
[im 36/36]
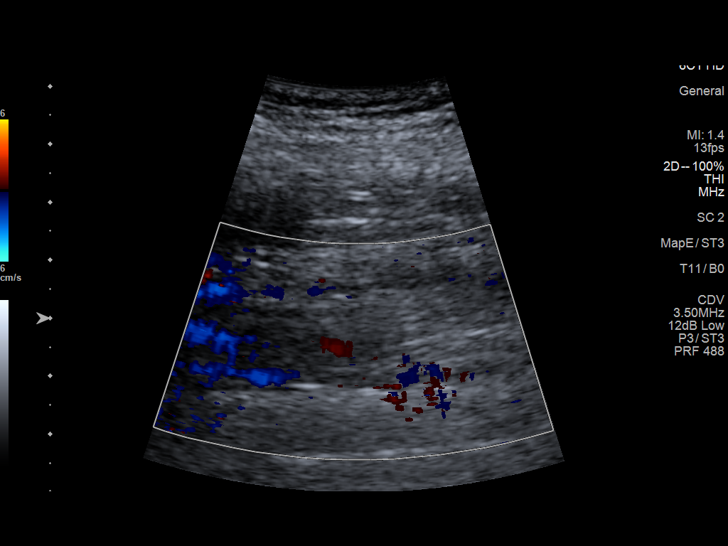

[14 of 24 positions shown; findings below may reference images not displayed]

FINDINGS: VENOUS

Normal compressibility of the common femoral, superficial femoral,
and popliteal veins, as well as the visualized calf veins.
Visualized portions of profunda femoral vein and great saphenous
vein unremarkable. No filling defects to suggest DVT on grayscale or
color Doppler imaging. Doppler waveforms show normal direction of
venous flow and normal response to augmentation.

Limited views of the contralateral common femoral vein are
unremarkable.

OTHER

None.

Limitations: none
IMPRESSION: No evidence of right lower extremity deep venous thrombosis.

## 2022-10-19 ENCOUNTER — Emergency Department
Admission: EM | Admit: 2022-10-19 | Discharge: 2022-10-20 | Disposition: A | Discharge status: Home / Self Care | Payer: No Typology Code available for payment source | Attending: Emergency Medicine | Admitting: Emergency Medicine

## 2022-10-19 ENCOUNTER — Encounter: Payer: Self-pay | Admitting: *Deleted

## 2022-10-19 ENCOUNTER — Other Ambulatory Visit: Payer: Self-pay

## 2022-10-19 DIAGNOSIS — F319 Bipolar disorder, unspecified: Secondary | ICD-10-CM | POA: Insufficient documentation

## 2022-10-19 DIAGNOSIS — F17200 Nicotine dependence, unspecified, uncomplicated: Secondary | ICD-10-CM | POA: Diagnosis not present

## 2022-10-19 DIAGNOSIS — F259 Schizoaffective disorder, unspecified: Secondary | ICD-10-CM | POA: Diagnosis not present

## 2022-10-19 DIAGNOSIS — I10 Essential (primary) hypertension: Secondary | ICD-10-CM | POA: Insufficient documentation

## 2022-10-19 DIAGNOSIS — F32A Depression, unspecified: Secondary | ICD-10-CM

## 2022-10-19 DIAGNOSIS — F419 Anxiety disorder, unspecified: Secondary | ICD-10-CM | POA: Insufficient documentation

## 2022-10-19 DIAGNOSIS — F3178 Bipolar disorder, in full remission, most recent episode mixed: Secondary | ICD-10-CM | POA: Diagnosis not present

## 2022-10-19 DIAGNOSIS — Y900 Blood alcohol level of less than 20 mg/100 ml: Secondary | ICD-10-CM | POA: Diagnosis not present

## 2022-10-19 DIAGNOSIS — Z7289 Other problems related to lifestyle: Secondary | ICD-10-CM

## 2022-10-19 LAB — COMPREHENSIVE METABOLIC PANEL
ALT: 15 U/L (ref 0–44)
AST: 21 U/L (ref 15–41)
Albumin: 3.9 g/dL (ref 3.5–5.0)
Alkaline Phosphatase: 31 U/L — ABNORMAL LOW (ref 38–126)
Anion gap: 9 (ref 5–15)
BUN: 12 mg/dL (ref 6–20)
CO2: 24 mmol/L (ref 22–32)
Calcium: 9.2 mg/dL (ref 8.9–10.3)
Chloride: 99 mmol/L (ref 98–111)
Creatinine, Ser: 0.74 mg/dL (ref 0.44–1.00)
GFR, Estimated: >60 mL/min (ref >60)
Glucose, Bld: 157 mg/dL — ABNORMAL HIGH (ref 70–99)
Potassium: 3.6 mmol/L (ref 3.5–5.1)
Sodium: 132 mmol/L — ABNORMAL LOW (ref 135–145)
Total Bilirubin: 0.4 mg/dL (ref 0.3–1.2)
Total Protein: 7.6 g/dL (ref 6.5–8.1)

## 2022-10-19 LAB — CBC
HCT: 37.5 % (ref 36.0–46.0)
Hemoglobin: 12 g/dL (ref 12.0–15.0)
MCH: 31.4 pg (ref 26.0–34.0)
MCHC: 32 g/dL (ref 30.0–36.0)
MCV: 98.2 fL (ref 80.0–100.0)
Platelets: 207 10*3/uL (ref 150–400)
RBC: 3.82 MIL/uL — ABNORMAL LOW (ref 3.87–5.11)
RDW: 13.2 % (ref 11.5–15.5)
WBC: 5.4 10*3/uL (ref 4.0–10.5)
nRBC: 0 % (ref 0.0–0.2)

## 2022-10-19 LAB — URINE DRUG SCREEN, QUALITATIVE (ARMC ONLY)
Amphetamines, Ur Screen: NOT DETECTED
Barbiturates, Ur Screen: NOT DETECTED
Benzodiazepine, Ur Scrn: NOT DETECTED
Cannabinoid 50 Ng, Ur ~~LOC~~: NOT DETECTED
Cocaine Metabolite,Ur ~~LOC~~: NOT DETECTED
MDMA (Ecstasy)Ur Screen: NOT DETECTED
Methadone Scn, Ur: NOT DETECTED
Opiate, Ur Screen: NOT DETECTED
Phencyclidine (PCP) Ur S: NOT DETECTED
Tricyclic, Ur Screen: NOT DETECTED

## 2022-10-19 LAB — ACETAMINOPHEN LEVEL: Acetaminophen (Tylenol), Serum: <10 ug/mL — ABNORMAL LOW (ref 10–30)

## 2022-10-19 LAB — SALICYLATE LEVEL: Salicylate Lvl: <7 mg/dL — ABNORMAL LOW (ref 7.0–30.0)

## 2022-10-19 LAB — ETHANOL: Alcohol, Ethyl (B): <10 mg/dL (ref <10)

## 2022-10-19 LAB — POC URINE PREG, ED: Preg Test, Ur: NEGATIVE

## 2022-10-19 NOTE — ED Notes (Signed)
Pt given ice cream and graham crackers at this time.

## 2022-10-19 NOTE — ED Triage Notes (Addendum)
Pt brought in by [facility] from family care home. Pt is Voluntary  Pt reports feeling depressed.  Pt denies SI or HI.  Pt denies drugs or etoh use.  Pt calm and cooperative.

## 2022-10-19 NOTE — ED Notes (Addendum)
Blue pants Green/white shirt Neon sneakers Tan bra Gray/black socks Fluffy blanket Pink underwear Stud heart ear rings  Pt unable to get bangle braclets off arms

## 2022-10-19 NOTE — ED Provider Notes (Addendum)
[facility] Provider Note    Event Date/Time   First MD Initiated Contact with Patient 10/19/22 1946     (approximate)  History   Chief Complaint: Psychiatric Evaluation  HPI  [name] is a 47 y.o. female with a past medical history, hypertension, presents emergency department for psychiatric evaluation.  According to the patient she was at her group facility and they wanted her to come to the emergency department for evaluation.  Initially she states it was because she was feeling somewhat depressed but then later admits that is because she made a superficial cut/scrape to her left wrist they were worried so they forced her to come per patient.  Here the patient denies any SI or HI, states she does not know why she scraped her self.  Denies any drugs or alcohol.  No medical complaints.  Physical Exam   Triage Vital Signs: ED Triage Vitals  Enc Vitals Group     BP 10/19/22 1916 (!) 152/93     Pulse Rate 10/19/22 1916 84     Resp 10/19/22 1916 20     Temp 10/19/22 1916 98 F (36.7 C)     Temp Source 10/19/22 1916 Oral     SpO2 10/19/22 1916 97 %     Weight 10/19/22 1914 180 lb (81.6 kg)     Height 10/19/22 1914 '5\' 4"'$  (1.626 m)     Head Circumference --      Peak Flow --      Pain Score 10/19/22 1913 0     Pain Loc --      Pain Edu? --      Excl. in [place]? --     Most recent vital signs: Vitals:   10/19/22 1916  BP: (!) 152/93  Pulse: 84  Resp: 20  Temp: 98 F (36.7 C)  SpO2: 97%    General: Awake, no distress.  CV:  Good peripheral perfusion.  Regular rate and rhythm  Resp:  Normal effort.  Equal breath sounds bilaterally.  Abd:  No distention.  Soft, nontender.  No rebound or guarding.  ED Results / Procedures / Treatments   MEDICATIONS ORDERED IN ED: Medications - No data to display   IMPRESSION / MDM / [place] / ED COURSE  I reviewed the triage vital signs and the nursing notes.  Patient's presentation is most  consistent with acute presentation with potential threat to life or bodily function.  Patient presents emergency department for psychiatric evaluation.  Made a superficial scrape to her left wrist.  No laceration no repair needed, very superficial.  Patient denies any SI or HI denies any medical complaints denies any drugs or alcohol.  Will have psychiatry TTS evaluate we will check labs and continue to closely monitor.  Patient agreeable to plan.  Patient CBC is normal, chemistry shows no concerning findings normal renal function, pregnancy test negative.  Patient's group home provider is now in the emergency department.  States that the patient became very agitated tonight was breaking items in her room including a picture of her mom.  She then took the glass out of the picture and attempted to cut her wrist.  Patient does have superficial cut to the wrist.  [facility] and the caregiver states this is the first time that the patient has acted like this.  Given this new information we will place the patient under an IVC until psychiatry can adequately  evaluate.  Remainder the lab work shows no concerning findings, normal CBC, negative acetaminophen salicylate and alcohol level.  Normal chemistry, negative drug screen.  Negative pregnancy test.  Patient has been seen and eval by psychiatry.  They believe the patient is likely safe for discharge back to her group facility but will be keeping the patient in the emergency department overnight plan to discharge back to her group facility in the morning.  Her IVC has been rescinded by psychiatry.  FINAL CLINICAL IMPRESSION(S) / ED DIAGNOSES   Depression   Note:  This document was prepared using Dragon voice recognition software and may include unintentional dictation errors.   Harvest Dark, MD 10/19/22 [id number]    Harvest Dark, MD 10/19/22 2211

## 2022-10-19 NOTE — BH Assessment (Signed)
Comprehensive Clinical Assessment (CCA) Note  10/19/2022 [name] [id number]  Chief Complaint: Patient is a 47 year old female presenting to [facility] ED under IVC. Per triage note Pt brought in by [facility] from family care home. Pt is Voluntary  Pt reports feeling depressed.  Pt denies SI or HI.  Pt denies drugs or etoh use.  Pt calm and cooperative. During assessment patient appears alert and oriented x4, calm and cooperative. Patient reports "I had things built up and not seeing my grandkids affected me, also losing both my moms and my dad passed away." "I was angry and I did something to try to hurt myself." Patient reports currently being in her care home for "7 years", patient reports before being in the care home "I was living alone and not taking care of myself." Patient reports a past diagnosis of Bipolar and Schizophrenia, she reports currently having a psychiatrist and a therapist and has medications with an injection that she gets monthly. Patient denies current SI/HI/AH/VH.   Per Psyc NP Ysidro Evert patient does not meet criteria for Inpatient, discharge in the morning Chief Complaint  Patient presents with   Psychiatric Evaluation   Visit Diagnosis: Bipolar, Schizophrenia per hx    CCA Screening, Triage and Referral (STR)  Patient Reported Information How did you hear about [place]? Family/Friend  Referral name: No data recorded Referral phone number: No data recorded  Whom do you see for routine medical problems? No data recorded Practice/Facility Name: No data recorded Practice/Facility Phone Number: No data recorded Name of Contact: No data recorded Contact Number: No data recorded Contact Fax Number: No data recorded Prescriber Name: No data recorded Prescriber Address (if known): No data recorded  What Is the Reason for Your Visit/Call Today? Pt brought in by [facility] from family care home. Pt is Voluntary  Pt reports feeling depressed.  Pt denies SI or HI.  Pt denies drugs  or etoh use.  Pt calm and cooperative.  How Long Has This Been Causing You Problems? > than 6 months  What Do You Feel Would Help You the Most Today? No data recorded  Have You Recently Been in Any Inpatient Treatment (Hospital/Detox/Crisis Center/28-Day Program)? No data recorded Name/Location of Program/Hospital:No data recorded How Long Were You There? No data recorded When Were You Discharged? No data recorded  Have You Ever Received Services From [facility] Before? No data recorded Who Do You See at [facility]? No data recorded  Have You Recently Had Any Thoughts About Hurting Yourself? No  Are You Planning to Commit Suicide/Harm Yourself At This time? No   Have you Recently Had Thoughts About [place]? No  Explanation: No data recorded  Have You Used Any Alcohol or Drugs in the Past 24 Hours? No  How Long Ago Did You Use Drugs or Alcohol? No data recorded What Did You Use and How Much? No data recorded  Do You Currently Have a Therapist/Psychiatrist? Yes  Name of Therapist/Psychiatrist: Patient's provdier and therapist are provided through her care home   Have You Been Recently Discharged From Any Office Practice or Programs? No  Explanation of Discharge From Practice/Program: No data recorded    CCA Screening Triage Referral Assessment Type of Contact: Face-to-Face  Is this Initial or Reassessment? No data recorded Date Telepsych consult ordered in [facility]:  No data recorded Time Telepsych consult ordered in [facility]:  No data recorded  Patient Reported Information Reviewed? No data recorded Patient Left Without Being Seen? No  data recorded Reason for Not Completing Assessment: No data recorded  Collateral Involvement: No data recorded  Does Patient Have a Carl? No data recorded Name and Contact of Legal Guardian: No data recorded If Minor and Not Living with Parent(s), Who has Custody? No data recorded Is CPS involved or  ever been involved? Never  Is APS involved or ever been involved? Never   Patient Determined To Be At Risk for Harm To Self or Others Based on Review of Patient Reported Information or Presenting Complaint? No  Method: No data recorded Availability of Means: No data recorded Intent: No data recorded Notification Required: No data recorded Additional Information for Danger to Others Potential: No data recorded Additional Comments for Danger to Others Potential: No data recorded Are There Guns or Other Weapons in Your Home? No  Types of Guns/Weapons: No data recorded Are These Weapons Safely Secured?                            No data recorded Who Could Verify You Are Able To Have These Secured: No data recorded Do You Have any Outstanding Charges, Pending Court Dates, Parole/Probation? No data recorded Contacted To Inform of Risk of Harm To Self or Others: No data recorded  Location of Assessment: [facility] ED   Does Patient Present under Involuntary Commitment? Yes  IVC Papers Initial File Date: No data recorded  [place] of Residence: [place]   Patient Currently Receiving the Following Services: Medication Management; Individual Therapy; Group Home   Determination of Need: Emergent (2 hours)   Options For Referral: Outpatient Therapy     CCA Biopsychosocial Intake/Chief Complaint:  No data recorded Current Symptoms/Problems: No data recorded  Patient Reported Schizophrenia/Schizoaffective Diagnosis in Past: Yes   Strengths: Patient is able to communicate her needs  Preferences: No data recorded Abilities: No data recorded  Type of Services Patient Feels are Needed: No data recorded  Initial Clinical Notes/Concerns: No data recorded  Mental Health Symptoms Depression:   Change in energy/activity; Fatigue   Duration of Depressive symptoms:  Greater than two weeks   Mania:   None   Anxiety:    None   Psychosis:   None   Duration of Psychotic symptoms:  No data recorded  Trauma:   None   Obsessions:   None   Compulsions:   None   Inattention:   None   Hyperactivity/Impulsivity:   None   Oppositional/Defiant Behaviors:   None   Emotional Irregularity:   None   Other Mood/Personality Symptoms:  No data recorded   Mental Status Exam Appearance and self-care  Stature:   Average   Weight:   Overweight   Clothing:   Casual   Grooming:   Normal   Cosmetic use:   None   Posture/gait:   Normal   Motor activity:   Not Remarkable   Sensorium  Attention:   Normal   Concentration:   Normal   Orientation:   X5   Recall/memory:   Normal   Affect and Mood  Affect:   Appropriate   Mood:   Other (Comment)   Relating  Eye contact:   Normal   Facial expression:   Responsive   Attitude toward examiner:   Cooperative   Thought and Language  Speech flow:  Clear and Coherent   Thought content:   Appropriate to Mood and Circumstances   Preoccupation:   None   Hallucinations:  None   Organization:  No data recorded  [employer] of Knowledge:   Fair   Intelligence:   Average   Abstraction:   Functional   Judgement:   Fair   Art therapist:   Adequate   Insight:   Fair   Decision Making:   Normal   Social Functioning  Social Maturity:   Responsible   Social Judgement:   Normal   Stress  Stressors:   Grief/losses; Family conflict   Coping Ability:   Normal   Skill Deficits:   None   Supports:   Family; Friends/Service system     Religion: Religion/Spirituality Are You A Religious Person?: No  Leisure/Recreation: Leisure / Recreation Do You Have Hobbies?: No  Exercise/Diet: Exercise/Diet Do You Exercise?: No Have You Gained or Lost A Significant Amount of Weight in the Past Six Months?: No Do You Follow a Special Diet?: No Do You Have Any Trouble Sleeping?: No   CCA Employment/Education Employment/Work Situation: Employment /  Work Technical sales engineer: On disability Why is Patient on Disability: Mental Health How Long has Patient Been on Disability: Unknown Patient's Job has Been Impacted by Current Illness: No Has Patient ever Been in the [employer]?: No  Education: Education Is Patient Currently Attending School?: No Did You Have An Individualized Education Program (IIEP): No Did You Have Any Difficulty At School?: No Patient's Education Has Been Impacted by Current Illness: No   CCA Family/Childhood History Family and Relationship History: Family history Marital status: Single Does patient have children?: Yes How many children?:  (Unknown) How is patient's relationship with their children?: Patient reports that she has not seen her grandchildre, unknown of th reason  Childhood History:  Childhood History By whom was/is the patient raised?: Both parents Did patient suffer any verbal/emotional/physical/sexual abuse as a child?: No Did patient suffer from severe childhood neglect?: No Has patient ever been sexually abused/assaulted/raped as an adolescent or adult?: No Was the patient ever a victim of a crime or a disaster?: No Witnessed domestic violence?: No Has patient been affected by domestic violence as an adult?: No  Child/Adolescent Assessment:     CCA Substance Use Alcohol/Drug Use: Alcohol / Drug Use Pain Medications: SEE MAR Prescriptions: SEE MAR Over the Counter: SEE MAR History of alcohol / drug use?: No history of alcohol / drug abuse                         ASAM's:  Six Dimensions of Multidimensional Assessment  Dimension 1:  Acute Intoxication and/or Withdrawal Potential:      Dimension 2:  Biomedical Conditions and Complications:      Dimension 3:  Emotional, Behavioral, or Cognitive Conditions and Complications:     Dimension 4:  Readiness to Change:     Dimension 5:  Relapse, Continued use, or Continued Problem Potential:     Dimension 6:   Recovery/Living Environment:     ASAM Severity Score:    ASAM Recommended Level of Treatment:     Substance use Disorder (SUD)    Recommendations for Services/Supports/Treatments:    DSM5 Diagnoses: There are no problems to display for this patient.   Patient Centered Plan: Patient is on the following Treatment Plan(s):  Depression   Referrals to Alternative Service(s): Referred to Alternative Service(s):   Place:   Date:   Time:    Referred to Alternative Service(s):   Place:   Date:   Time:    Referred  to Alternative Service(s):   Place:   Date:   Time:    Referred to Alternative Service(s):   Place:   Date:   Time:      '@[facility]'$ @  [employer], LCAS-A

## 2022-10-19 NOTE — Discharge Instructions (Signed)
You have been seen in the emergency department for a  psychiatric concern. You have been evaluated both medically as well as psychiatrically. Please follow-up with your outpatient resources provided. Return to the emergency department for any worsening symptoms, or any thoughts of hurting yourself or anyone else so that we may attempt to help you.

## 2022-10-20 DIAGNOSIS — F3178 Bipolar disorder, in full remission, most recent episode mixed: Secondary | ICD-10-CM

## 2022-10-20 DIAGNOSIS — F319 Bipolar disorder, unspecified: Secondary | ICD-10-CM | POA: Insufficient documentation

## 2022-10-20 DIAGNOSIS — F259 Schizoaffective disorder, unspecified: Secondary | ICD-10-CM | POA: Insufficient documentation

## 2022-10-20 NOTE — ED Notes (Signed)
IVC  RESCINDED PER  NP  INFORMED  BILL RN

## 2022-10-20 NOTE — ED Notes (Signed)
E-signature not working at this time. Pt verbalized understanding of D/C instructions, prescriptions and follow up care with no further questions at this time. Pt in NAD and ambulatory at time of D/C. Discharge instructions discussed with care home owner as well.

## 2022-10-20 NOTE — ED Notes (Signed)
Pt given belongings bag 1/1 at time of discharge. Group home owner here to pick up patient at this time.

## 2022-10-20 NOTE — Consult Note (Signed)
[facility] Consult   Reason for Consult:  Psychiatric Evaluation  Referring Physician:  Dr. Kerman Passey Patient Identification: [name] MRN:  [id number] Principal Diagnosis: <principal problem not specified> Diagnosis:  Active Problems:   Bipolar disorder ([place])   Schizoaffective disorder ([place])   Total Time spent with patient: 1 hour  Subjective: "I was feeling sad and I did some things that I am not proud of."  [name] is a 47 y.o. female patient presented to [facility] ED via law enforcement initially she was voluntary but placed under involuntary commitment status (IVC) when she stated feeling depressed. The patient was brought in from a [facility] and the staff was also with the patient.  The patient shared that she has gone through significant emotional and psychological challenges, compounded by losses and the strain of managing mental health conditions like Bipolar disorder and Schizophrenia. It's positive that she has sought help and is currently under the care of a psychiatrist and therapist, as well as receiving medication through injections, Abilify which she received about a week ago. The patient shared that she's actively engaged in managing her mental health. The patient mention feeling isolated from her grandkids and experiencing multiple losses, including both parents, likely contributed to her emotional distress. Additionally, her admission of attempting self-harm underscores the severity of her struggles.The patient shared living in a care home for seven years. The patient does acknowledge that she was not taking care of herself while living alone before entering the care home.  The patient was seen face-to-face by this provider; chart reviewed and consulted with Dr. Kerman Passey on 10/19/2022 due to the care of the patient. It was discussed with the EDP that the patient does not meet the criteria to be admitted to the psychiatric inpatient  unit.The patient will remain under observation overnight. Her IVC will be rescinded, and the patient will be discharged in the morning if she remains psychiatric stable. On evaluation the patient is alert and oriented x 4, calm, cooperative, and mood-congruent with affect. The patient does not appear to be responding to internal or external stimuli. Neither is the patient presenting with any delusional thinking. The patient denies auditory or visual hallucinations. The patient denies any suicidal, homicidal, or self-harm ideations. The patient is not presenting with any psychotic or paranoid behaviors. During an encounter with the patient, he/she was able to answer questions appropriately.  Collateral: Mr. Simona Huh, a staff member at the family care home shared that the patient has been doing well for sometime. He discussed that last week, the patient asked him for a razor, and when he gave it to her. The patient said, "I am feeling that S-word." Mr. Simona Huh shared he took the razor from her, and he stayed over an hour talking to the patient and letting her know that wanting to harm herself is not the answer to her problems. He continued to share that the patient was good when he left her last Thursday.  He shared that today, the entire day was good. The patient had dinner, and she was doing okay. "At least that's what I thought." He said that when he called her to let her know that dinner was being served, he suspected that she was in the bathroom and had been crying. He stated he was unsure.  He continued to share that he heard glass breaking after dinner, but he did not think it was the patient breaking glass in her room and that it was being  done on purpose. When he went to check on her, it was when he realized that she had broken the and had a piece of the broken glass in her hand, and she showed him where she cut her left posterior forearm.   HPI: Per Dr. Kerman Passey, [name] is a 47 y.o. female  with a past medical history, hypertension, presents emergency department for psychiatric evaluation.  According to the patient she was at her group facility and they wanted her to come to the emergency department for evaluation.  Initially she states it was because she was feeling somewhat depressed but then later admits that is because she made a superficial cut/scrape to her left wrist they were worried so they forced her to come per patient.  Here the patient denies any SI or HI, states she does not know why she scraped her self.  Denies any drugs or alcohol.  No medical complaints.   Past Psychiatric History:  Anxiety Panic disorder  Risk to Self:   Risk to Others:   Prior Inpatient Therapy:   Prior Outpatient Therapy:    Past Medical History:  Past Medical History:  Diagnosis Date   Anxiety    Hypertension    Panic disorder    History reviewed. No pertinent surgical history. Family History: History reviewed. No pertinent family history. Family Psychiatric  History: Mom-depression Social History:  Social History   Substance and Sexual Activity  Alcohol Use No     Social History   Substance and Sexual Activity  Drug Use No    Social History   Socioeconomic History   Marital status: Single    Spouse name: Not on file   Number of children: Not on file   Years of education: Not on file   Highest education level: Not on file  Occupational History   Not on file  Tobacco Use   Smoking status: Every Day   Smokeless tobacco: Never  Vaping Use   Vaping Use: Some days   Substances: Nicotine, Flavoring  Substance and Sexual Activity   Alcohol use: No   Drug use: No   Sexual activity: Not Currently    Birth control/protection: Surgical    Comment: BTL  Other Topics Concern   Not on file  Social History Narrative   Not on file   Social Determinants of Health   Financial Resource Strain: Not on file  Food Insecurity: Not on file  Transportation Needs: Not on file   Physical Activity: Not on file  Stress: Not on file  Social Connections: Not on file   Additional Social History:    Allergies:   Allergies  Allergen Reactions   Penicillins Nausea And Vomiting    Labs:  Results for orders placed or performed during the hospital encounter of 10/19/22 (from the past 48 hour(s))  Comprehensive metabolic panel     Status: Abnormal   Collection Time: 10/19/22  7:18 PM  Result Value Ref Range   Sodium 132 (L) 135 - 145 mmol/L   Potassium 3.6 3.5 - 5.1 mmol/L   Chloride 99 98 - 111 mmol/L   CO2 24 22 - 32 mmol/L   Glucose, Bld 157 (H) 70 - 99 mg/dL    Comment: Glucose reference range applies only to samples taken after fasting for at least 8 hours.   BUN 12 6 - 20 mg/dL   Creatinine, Ser 0.74 0.44 - 1.00 mg/dL   Calcium 9.2 8.9 - 10.3 mg/dL   Total Protein 7.6  6.5 - 8.1 g/dL   Albumin 3.9 3.5 - 5.0 g/dL   AST 21 15 - 41 U/L   ALT 15 0 - 44 U/L   Alkaline Phosphatase 31 (L) 38 - 126 U/L   Total Bilirubin 0.4 0.3 - 1.2 mg/dL   GFR, Estimated >60 >60 mL/min    Comment: (NOTE) Calculated using the CKD-EPI Creatinine Equation (2021)    Anion gap 9 5 - 15    Comment: Performed at [facility], [address] [place], [place] [address]  Ethanol     Status: None   Collection Time: 10/19/22  7:18 PM  Result Value Ref Range   Alcohol, Ethyl (B) <10 <10 mg/dL    Comment: (NOTE) Lowest detectable limit for serum alcohol is 10 mg/dL.  For medical purposes only. Performed at [facility], [address] [place], [place] [id number]   Salicylate level     Status: Abnormal   Collection Time: 10/19/22  7:18 PM  Result Value Ref Range   Salicylate Lvl Q000111Q (L) 7.0 - 30.0 mg/dL    Comment: Performed at [facility], [address] [place], [place] [address]  Acetaminophen level     Status: Abnormal   Collection Time: 10/19/22  7:18 PM  Result Value Ref Range   Acetaminophen (Tylenol), Serum <10 (L) 10 - 30 ug/mL     Comment: (NOTE) Therapeutic concentrations vary significantly. A range of 10-30 ug/mL  may be an effective concentration for many patients. However, some  are best treated at concentrations outside of this range. Acetaminophen concentrations >150 ug/mL at 4 hours after ingestion  and >50 ug/mL at 12 hours after ingestion are often associated with  toxic reactions.  Performed at [facility], [address] [place], [place] [address]   cbc     Status: Abnormal   Collection Time: 10/19/22  7:18 PM  Result Value Ref Range   WBC 5.4 4.0 - 10.5 K/uL   RBC 3.82 (L) 3.87 - 5.11 MIL/uL   Hemoglobin 12.0 12.0 - 15.0 g/dL   HCT 37.5 36.0 - 46.0 %   MCV 98.2 80.0 - 100.0 fL   MCH 31.4 26.0 - 34.0 pg   MCHC 32.0 30.0 - 36.0 g/dL   RDW 13.2 11.5 - 15.5 %   Platelets 207 150 - 400 K/uL   nRBC 0.0 0.0 - 0.2 %    Comment: Performed at [facility], [address] [place], [place] [address]  Urine Drug Screen, Qualitative ([place] only)     Status: None   Collection Time: 10/19/22  7:43 PM  Result Value Ref Range   Tricyclic, Ur Screen NONE DETECTED NONE DETECTED   Amphetamines, Ur Screen NONE DETECTED NONE DETECTED   MDMA (Ecstasy)Ur Screen NONE DETECTED NONE DETECTED   Cocaine Metabolite,Ur [place] NONE DETECTED NONE DETECTED   Opiate, Ur Screen NONE DETECTED NONE DETECTED   Phencyclidine (PCP) Ur S NONE DETECTED NONE DETECTED   Cannabinoid 50 Ng, Ur [place] NONE DETECTED NONE DETECTED   Barbiturates, Ur Screen NONE DETECTED NONE DETECTED   Benzodiazepine, Ur Scrn NONE DETECTED NONE DETECTED   Methadone Scn, Ur NONE DETECTED NONE DETECTED    Comment: (NOTE) Tricyclics + metabolites, urine    Cutoff 1000 ng/mL Amphetamines + metabolites, urine  Cutoff 1000 ng/mL MDMA (Ecstasy), urine              Cutoff 500 ng/mL Cocaine Metabolite, urine          Cutoff  300 ng/mL Opiate + metabolites, urine        Cutoff 300 ng/mL Phencyclidine (PCP), urine         Cutoff 25 ng/mL Cannabinoid,  urine                 Cutoff 50 ng/mL Barbiturates + metabolites, urine  Cutoff 200 ng/mL Benzodiazepine, urine              Cutoff 200 ng/mL Methadone, urine                   Cutoff 300 ng/mL  The urine drug screen provides only a preliminary, unconfirmed analytical test result and should not be used for non-medical purposes. Clinical consideration and professional judgment should be applied to any positive drug screen result due to possible interfering substances. A more specific alternate chemical method must be used in order to obtain a confirmed analytical result. Gas chromatography / mass spectrometry (GC/MS) is the preferred confirm atory method. Performed at [facility], [address] [place], [place] [address]   POC urine preg, ED     Status: None   Collection Time: 10/19/22  7:44 PM  Result Value Ref Range   Preg Test, Ur Negative Negative    No current facility-administered medications for this encounter.   Current Outpatient Medications  Medication Sig Dispense Refill   ferrous sulfate 325 (65 FE) MG tablet Take 325 mg by mouth daily with breakfast.     QUEtiapine (SEROQUEL) 300 MG tablet Take 300 mg by mouth at bedtime.     QUEtiapine (SEROQUEL) 300 MG tablet Take 300 mg by mouth at bedtime.     traZODone (DESYREL) 50 MG tablet Take 50 mg by mouth at bedtime.      Musculoskeletal: Strength & Muscle Tone: within normal limits Gait & Station: normal Patient leans: N/A Psychiatric Specialty Exam:  Presentation  General Appearance:  Appropriate for Environment  Eye Contact: Good  Speech: Clear and Coherent  Speech Volume: Normal  Handedness: Right   Mood and Affect  Mood: Euthymic  Affect: Appropriate   Thought Process  Thought Processes:No data recorded Descriptions of Associations:Intact  Orientation:Full (Time, Place and Person)  Thought Content:Logical  History of Schizophrenia/Schizoaffective disorder:Yes  Duration  of Psychotic Symptoms:No data recorded Hallucinations:Hallucinations: None  Ideas of Reference:None  Suicidal Thoughts:Suicidal Thoughts: No  Homicidal Thoughts:Homicidal Thoughts: No   Sensorium  Memory: Immediate Good; Recent Good; Remote Good  Judgment: Good  Insight: Good   Executive Functions  Concentration: Good  Attention Span: Good  Recall: Good  Fund of Knowledge: Good  Language: Good   Psychomotor Activity  Psychomotor Activity: Psychomotor Activity: Normal   Assets  Assets: Communication Skills; Desire for Improvement; Financial Resources/Insurance; Resilience; Social Support; Leisure Time   Sleep  Sleep: Number of Hours of Sleep: 8   Physical Exam: Physical Exam Vitals and nursing note reviewed.  Constitutional:      Appearance: Normal appearance. She is normal weight.  HENT:     Head: Normocephalic and atraumatic.     Right Ear: External ear normal.     Left Ear: External ear normal.     Nose: Nose normal.     Mouth/Throat:     Mouth: Mucous membranes are moist.  Cardiovascular:     Rate and Rhythm: Normal rate.     Pulses: Normal pulses.  Pulmonary:     Effort: Pulmonary effort is normal.  Musculoskeletal:        General: Normal range  of motion.     Cervical back: Normal range of motion and neck supple.  Neurological:     General: No focal deficit present.     Mental Status: She is alert and oriented to person, place, and time.  Psychiatric:        Attention and Perception: Attention and perception normal.        Mood and Affect: Affect normal. Mood is anxious.        Speech: Speech normal.        Behavior: Behavior normal. Behavior is cooperative.        Thought Content: Thought content normal.        Cognition and Memory: Cognition and memory normal.        Judgment: Judgment normal.    Review of Systems  Psychiatric/Behavioral:  The patient is nervous/anxious.    Blood pressure (!) 152/93, pulse 84, temperature  98 F (36.7 C), temperature source Oral, resp. rate 20, height '5\' 4"'$  (1.626 m), weight 81.6 kg, last menstrual period 10/05/2022, SpO2 97 %. Body mass index is 30.9 kg/m.  Treatment Plan Summary: Plan   The patient will remain under observation overnight. Her IVC will be rescinded, and the patient will be discharged in the morning if she remains psychiatric stable.  Disposition: No evidence of imminent risk to self or others at present.   Patient does not meet criteria for psychiatric inpatient admission. Supportive therapy provided about ongoing stressors. Discussed crisis plan, support from social network, calling 911, coming to the Emergency Department, and calling Suicide Hotline.  Caroline Sauger, NP 10/20/2022 1:45 AM

## 2022-10-20 NOTE — ED Provider Notes (Signed)
Emergency Medicine Observation Re-evaluation Note  [name] is a 47 y.o. female, seen on rounds today.  Pt initially presented to the ED for complaints of Psychiatric Evaluation Currently, the patient is resting, voices no medical complaints.  Physical Exam  BP (!) 152/93 (BP Location: Left Arm)   Pulse 84   Temp 98 F (36.7 C) (Oral)   Resp 20   Ht '5\' 4"'$  (1.626 m)   Wt 81.6 kg   LMP 10/05/2022 (Approximate)   SpO2 97%   BMI 30.90 kg/m  Physical Exam General: Resting in no acute distress Cardiac: No cyanosis Lungs: Equal rise and fall Psych: Not agitated  ED Course / MDM  EKG:   I have reviewed the labs performed to date as well as medications administered while in observation.  Recent changes in the last 24 hours include no events overnight.  Plan  Current plan is for discharge back to facility this morning.  IVC has been rescinded by overnight psychiatric NP.    Paulette Blanch, MD 10/20/22 [phone]

## 2023-01-21 ENCOUNTER — Other Ambulatory Visit: Payer: Self-pay

## 2023-02-25 ENCOUNTER — Ambulatory Visit: Payer: Medicaid Other | Admitting: Physician Assistant

## 2023-03-01 ENCOUNTER — Encounter: Payer: Self-pay | Admitting: Physician Assistant

## 2023-03-01 ENCOUNTER — Ambulatory Visit (INDEPENDENT_AMBULATORY_CARE_PROVIDER_SITE_OTHER): Payer: MEDICAID | Admitting: Physician Assistant

## 2023-03-01 VITALS — BP 112/81 | HR 88 | Temp 98.8°F | Ht 64.0 in | Wt 173.2 lb

## 2023-03-01 DIAGNOSIS — K219 Gastro-esophageal reflux disease without esophagitis: Secondary | ICD-10-CM

## 2023-03-01 DIAGNOSIS — Z1211 Encounter for screening for malignant neoplasm of colon: Secondary | ICD-10-CM | POA: Diagnosis not present

## 2023-03-01 MED ORDER — PEG 3350-KCL-NA BICARB-NACL 420 G PO SOLR: 4000.0000 mL | Freq: Once | ORAL | 0 refills | Status: AC

## 2023-03-01 NOTE — Progress Notes (Signed)
Celso Amy, PA-C [address]  [address]  [place], [place] [address]  Main: [phone]  Fax: [phone]   Gastroenterology Consultation  Referring Provider:     Sherrie Mustache, MD Primary Care Physician:  Sherrie Mustache, MD Primary Gastroenterologist:  Celso Amy, PA-C / Dr. Wyline Mood   Reason for Consultation:     GERD, Colon Cancer Screening        HPI:   [name] is a 47 y.o. y/o female referred for consultation & management  by Sherrie Mustache, MD. She lives in a group home.  She is here today with her caregiver.  47 year old female is referred by her PCP to evaluate acid reflux.  She has had intermittent acid reflux for several years.  Was taking Prilosec 20 mg once daily which controlled her reflux.  Her PCP discontinued PPI and she had increased vomiting acid with cough and gagging.  He was concerned about her long-term use of PPI.  Now she takes Prilosec 20 mg as needed which is not controlling her symptoms.  She admits to occasional mild solid food dysphagia symptoms.  She has never had a colonoscopy.  She is due for her screening colonoscopy.  She denies any lower GI symptoms such as abdominal pain, bowel irregularities, or rectal bleeding.  She takes ferrous sulfate 325 mg daily.  Has history of iron deficiency.  Family history is unknown.  Both parents are deceased.  Past Medical History:  Diagnosis Date   Anxiety    Hypertension    Panic disorder     History reviewed. No pertinent surgical history.  Prior to Admission medications   Medication Sig Start Date End Date Taking? Authorizing Provider  ferrous sulfate 325 (65 FE) MG tablet Take 325 mg by mouth daily with breakfast.   Yes [provider]  QUEtiapine (SEROQUEL) 300 MG tablet Take 300 mg by mouth at bedtime.   Yes [provider]  QUEtiapine (SEROQUEL) 300 MG tablet Take 300 mg by mouth at bedtime.   Yes [provider]  traZODone (DESYREL) 50 MG tablet Take  50 mg by mouth at bedtime.   Yes [provider]    History reviewed. No pertinent family history.   Social History   Tobacco Use   Smoking status: Every Day   Smokeless tobacco: Never  Vaping Use   Vaping status: Some Days   Substances: Nicotine, Flavoring  Substance Use Topics   Alcohol use: No   Drug use: No    Allergies as of 03/01/2023 - Review Complete 03/01/2023  Allergen Reaction Noted   Penicillins Nausea And Vomiting 05/09/2013    Review of Systems:    All systems reviewed and negative except where noted in HPI.   Physical Exam:  BP 112/81   Pulse 88   Temp 98.8 F (37.1 C)   Ht 5\' 4"  (1.626 m)   Wt 173 lb 3.2 oz (78.6 kg)   BMI 29.73 kg/m  No LMP recorded. Psych:  Alert and cooperative. Flat mood and affect. General:   Alert,  Well-developed, well-nourished, pleasant and cooperative in NAD Head:  Normocephalic and atraumatic. Eyes:  Sclera clear, no icterus.   Conjunctiva pink. Neck:  Supple; no masses or thyromegaly. Lungs:  Respirations even and unlabored.  Clear throughout to auscultation.   No wheezes, crackles, or rhonchi. No acute distress. Heart:  Regular rate and rhythm; no murmurs, clicks, rubs, or gallops. Abdomen:  Normal bowel sounds.  No bruits.  Soft, and  non-distended without masses, hepatosplenomegaly or hernias noted.  No Tenderness.  No guarding or rebound tenderness.    Neurologic:  Alert and oriented x3;  grossly normal neurologically. Psych:  Alert and cooperative. Flat mood and affect.  Slow responses.  Imaging Studies: No results found.  Assessment and Plan:   [name] is a 47 y.o. y/o female has been referred for:  1.  GERD  Start Prilosec (omeprazole) 20 Mg 1 tablet once daily, take 30 minutes before breakfast.  Recommend Lifestyle Modifications to prevent Acid Reflux.  Rec. Avoid coffee, sodas, peppermint, citrus fruits, and spicey foods.  Avoid eating 2-3 hours before bedtime.   We discussed adverse side  effects of PPIs to include vitamin deficiencies, osteoporosis, renal insufficiency, dementia and increased risk of C. Difficile.  Recommend take lowest effective dose of PPI necessary to control acid reflux.  OK to add H2RB (Pepcid 20mg  daily) or antiacid if needed for breakthrough acid reflux.   Scheduling EGD - Screen for Barrett's I discussed risks of EGD with patient to include risk of bleeding, perforation, and risk of sedation.  Patient expressed understanding and agrees to proceed with EGD.   2.  Colon cancer screening  Scheduling First Colonoscopy I discussed risks of colonoscopy with patient to include risk of bleeding, colon perforation, and risk of sedation.  Patient expressed understanding and agrees to proceed with colonoscopy.   Follow up based on EGD and colonoscopy results.  Also follow-up based on GI symptoms.  Celso Amy, PA-C

## 2023-03-18 ENCOUNTER — Other Ambulatory Visit: Payer: Self-pay | Admitting: Physician Assistant

## 2023-04-20 ENCOUNTER — Encounter: Payer: Self-pay | Admitting: Gastroenterology

## 2023-04-21 ENCOUNTER — Ambulatory Visit
Admission: RE | Admit: 2023-04-21 | Discharge: 2023-04-21 | Disposition: A | Discharge status: Home / Self Care | Payer: MEDICAID | Attending: Gastroenterology | Admitting: Gastroenterology

## 2023-04-21 ENCOUNTER — Ambulatory Visit: Payer: MEDICAID | Admitting: Certified Registered"

## 2023-04-21 ENCOUNTER — Encounter
Admission: RE | Disposition: A | Discharge status: Home / Self Care | Payer: Self-pay | Source: Home / Self Care | Attending: Gastroenterology

## 2023-04-21 ENCOUNTER — Encounter: Payer: Self-pay | Admitting: Gastroenterology

## 2023-04-21 DIAGNOSIS — K635 Polyp of colon: Secondary | ICD-10-CM | POA: Insufficient documentation

## 2023-04-21 DIAGNOSIS — Z1211 Encounter for screening for malignant neoplasm of colon: Secondary | ICD-10-CM

## 2023-04-21 DIAGNOSIS — K219 Gastro-esophageal reflux disease without esophagitis: Secondary | ICD-10-CM | POA: Diagnosis present

## 2023-04-21 DIAGNOSIS — D125 Benign neoplasm of sigmoid colon: Secondary | ICD-10-CM | POA: Diagnosis not present

## 2023-04-21 DIAGNOSIS — I10 Essential (primary) hypertension: Secondary | ICD-10-CM | POA: Diagnosis not present

## 2023-04-21 DIAGNOSIS — K449 Diaphragmatic hernia without obstruction or gangrene: Secondary | ICD-10-CM | POA: Insufficient documentation

## 2023-04-21 DIAGNOSIS — F17290 Nicotine dependence, other tobacco product, uncomplicated: Secondary | ICD-10-CM | POA: Diagnosis not present

## 2023-04-21 DIAGNOSIS — F319 Bipolar disorder, unspecified: Secondary | ICD-10-CM | POA: Insufficient documentation

## 2023-04-21 HISTORY — PX: COLONOSCOPY WITH PROPOFOL: SHX5780

## 2023-04-21 HISTORY — PX: ESOPHAGOGASTRODUODENOSCOPY (EGD) WITH PROPOFOL: SHX5813

## 2023-04-21 HISTORY — PX: POLYPECTOMY: SHX5525

## 2023-04-21 LAB — POCT PREGNANCY, URINE: Preg Test, Ur: NEGATIVE

## 2023-04-21 SURGERY — COLONOSCOPY WITH PROPOFOL
Anesthesia: General

## 2023-04-21 MED ORDER — PROPOFOL 1000 MG/100ML IV EMUL
INTRAVENOUS | Status: AC
  Filled 2023-04-21: qty 100

## 2023-04-21 MED ORDER — SODIUM CHLORIDE 0.9 % IV SOLN: INTRAVENOUS | Status: DC

## 2023-04-21 MED ORDER — LIDOCAINE HCL (CARDIAC) PF 100 MG/5ML IV SOSY
PREFILLED_SYRINGE | INTRAVENOUS | Status: DC | PRN
  Administered 2023-04-21: 100 mg via INTRAVENOUS

## 2023-04-21 MED ORDER — PROPOFOL 10 MG/ML IV BOLUS
INTRAVENOUS | Status: DC | PRN
  Administered 2023-04-21: 20 mg via INTRAVENOUS
  Administered 2023-04-21: 70 mg via INTRAVENOUS

## 2023-04-21 MED ORDER — PROPOFOL 500 MG/50ML IV EMUL
INTRAVENOUS | Status: DC | PRN
  Administered 2023-04-21: 100 ug/kg/min via INTRAVENOUS

## 2023-04-21 NOTE — H&P (Signed)
Wyline Mood, MD [address], [address], [place], [place], [address] [address], [address], [place], [place], [address] Phone: [phone]  Fax: [phone]  Primary Care Physician:  Sherrie Mustache, MD   Pre-Procedure History & Physical: HPI:  [name] is a 47 y.o. female is here for an endoscopy and colonoscopy    Past Medical History:  Diagnosis Date   Anxiety    Hypertension    Panic disorder     History reviewed. No pertinent surgical history.  Prior to Admission medications   Medication Sig Start Date End Date Taking? Authorizing Provider  ferrous sulfate 325 (65 FE) MG tablet Take 325 mg by mouth daily with breakfast.   Yes [provider]  omeprazole (PRILOSEC) 20 MG capsule TAKE 1 CAPSULE BY MOUTH ONCE DAILY 30 MINS BEFORE BREAKFAST *TAKE ON AN EMPTY STOMACH* *DO NOT CRUSH OR CHEW* 03/19/23  Yes Celso Amy, PA-C  QUEtiapine (SEROQUEL) 300 MG tablet Take 300 mg by mouth at bedtime.    [provider]  QUEtiapine (SEROQUEL) 300 MG tablet Take 300 mg by mouth at bedtime.    [provider]  traZODone (DESYREL) 50 MG tablet Take 50 mg by mouth at bedtime.    [provider]    Allergies as of 03/01/2023 - Review Complete 03/01/2023  Allergen Reaction Noted   Penicillins Nausea And Vomiting 05/09/2013    History reviewed. No pertinent family history.  Social History   Socioeconomic History   Marital status: Single    Spouse name: Not on file   Number of children: Not on file   Years of education: Not on file   Highest education level: Not on file  Occupational History   Not on file  Tobacco Use   Smoking status: Every Day   Smokeless tobacco: Never  Vaping Use   Vaping status: Some Days   Substances: Nicotine, Flavoring  Substance and Sexual Activity   Alcohol use: No   Drug use: No   Sexual activity: Not Currently    Birth control/protection: Surgical    Comment: BTL  Other Topics Concern   Not on  file  Social History Narrative   Not on file   Social Determinants of Health   Financial Resource Strain: Not on file  Food Insecurity: Not on file  Transportation Needs: Not on file  Physical Activity: Not on file  Stress: Not on file  Social Connections: Not on file  Intimate Partner Violence: Not on file    Review of Systems: See HPI, otherwise negative ROS  Physical Exam: BP 93/69   Pulse 77   Temp (!) 97.3 F (36.3 C) (Temporal)   Resp 16   Ht 5\' 4"  (1.626 m)   Wt 76.7 kg   SpO2 100%   BMI 29.01 kg/m  General:   Alert,  pleasant and cooperative in NAD Head:  Normocephalic and atraumatic. Neck:  Supple; no masses or thyromegaly. Lungs:  Clear throughout to auscultation, normal respiratory effort.    Heart:  +S1, +S2, Regular rate and rhythm, No edema. Abdomen:  Soft, nontender and nondistended. Normal bowel sounds, without guarding, and without rebound.   Neurologic:  Alert and  oriented x4;  grossly normal neurologically.  Impression/Plan: [name] is here for an endoscopy and colonoscopy  to be performed for  evaluation of GERD and colon cancer screening     Risks, benefits, limitations, and alternatives regarding endoscopy have been reviewed with the patient.  Questions  have been answered.  All parties agreeable.   Wyline Mood, MD  04/21/2023, 7:44 AM

## 2023-04-21 NOTE — Op Note (Signed)
[facility] Gastroenterology Patient Name: [name] Procedure Date: 04/21/2023 7:49 AM MRN: [id number] Account #: [id number] Date of Birth: Jan 13, 1976 Admit Type: Outpatient Age: 47 Room: [facility] ENDO ROOM 3 Gender: Female Note Status: Finalized Instrument Name: Upper Endoscope [id number] Procedure:             Upper GI endoscopy Indications:           Follow-up of gastro-esophageal reflux disease Providers:             Wyline Mood MD, MD Referring MD:          Wyline Mood MD, MD (Referring MD), Sherrie Mustache, MD                         (Referring MD) Medicines:             Monitored Anesthesia Care Complications:         No immediate complications. Procedure:             Pre-Anesthesia Assessment:                        - Prior to the procedure, a History and Physical was                         performed, and patient medications, allergies and                         sensitivities were reviewed. The patient's tolerance                         of previous anesthesia was reviewed.                        - The risks and benefits of the procedure and the                         sedation options and risks were discussed with the                         patient. All questions were answered and informed                         consent was obtained.                        - ASA Grade Assessment: II - A patient with mild                         systemic disease.                        After obtaining informed consent, the endoscope was                         passed under direct vision. Throughout the procedure,                         the patient's blood pressure, pulse, and oxygen  saturations were monitored continuously. The Endoscope                         was introduced through the mouth, and advanced to the                         third part of duodenum. The upper GI endoscopy was                         accomplished with ease. The patient  tolerated the                         procedure well. Findings:      The esophagus was normal.      The examined duodenum was normal.      A medium-sized hiatal hernia was present.      The cardia and gastric fundus were normal on retroflexion. Impression:            - Normal esophagus.                        - Normal examined duodenum.                        - Medium-sized hiatal hernia.                        - No specimens collected. Recommendation:        - Perform a colonoscopy today. Procedure Code(s):     --- Professional ---                        [phone], Esophagogastroduodenoscopy, flexible,                         transoral; diagnostic, including collection of                         specimen(s) by brushing or washing, when performed                         (separate procedure) Diagnosis Code(s):     --- Professional ---                        K44.9, Diaphragmatic hernia without obstruction or                         gangrene                        K21.9, Gastro-esophageal reflux disease without                         esophagitis CPT copyright 2022 American Medical Association. All rights reserved. The codes documented in this report are preliminary and upon coder review may  be revised to meet current compliance requirements. Wyline Mood, MD Wyline Mood MD, MD 04/21/2023 7:59:28 AM This report has been signed electronically. Number of Addenda: 0 Note Initiated On: 04/21/2023 7:49 AM Estimated Blood Loss:  Estimated blood loss: none.      [facility]

## 2023-04-21 NOTE — Anesthesia Postprocedure Evaluation (Signed)
Anesthesia Post Note  Patient: [name]  Procedure(s) Performed: COLONOSCOPY WITH PROPOFOL ESOPHAGOGASTRODUODENOSCOPY (EGD) WITH PROPOFOL  Patient location during evaluation: Endoscopy Anesthesia Type: General Level of consciousness: awake and alert Pain management: pain level controlled Vital Signs Assessment: post-procedure vital signs reviewed and stable Respiratory status: spontaneous breathing, nonlabored ventilation, respiratory function stable and patient connected to nasal cannula oxygen Cardiovascular status: blood pressure returned to baseline and stable Postop Assessment: no apparent nausea or vomiting Anesthetic complications: no  No notable events documented.   Last Vitals:  Vitals:   04/21/23 0827 04/21/23 0837  BP: 111/87 125/84  Pulse: 80 77  Resp: 15 17  Temp:    SpO2: 100% 100%    Last Pain:  Vitals:   04/21/23 0837  TempSrc:   PainSc: 0-No pain                 Stephanie Coup

## 2023-04-21 NOTE — Anesthesia Preprocedure Evaluation (Signed)
Anesthesia Evaluation  Patient identified by MRN, date of birth, ID band Patient awake    Reviewed: Allergy & Precautions, NPO status , Patient's Chart, lab work & pertinent test results  Airway Mallampati: III  TM Distance: >3 FB Neck ROM: full    Dental  (+) Chipped   Pulmonary neg pulmonary ROS, Current Smoker and Patient abstained from smoking.   Pulmonary exam normal        Cardiovascular hypertension, negative cardio ROS Normal cardiovascular exam     Neuro/Psych  PSYCHIATRIC DISORDERS Anxiety  Bipolar Disorder Schizophrenia  negative neurological ROS     GI/Hepatic Neg liver ROS,GERD  Medicated,,  Endo/Other  negative endocrine ROS    Renal/GU negative Renal ROS  negative genitourinary   Musculoskeletal   Abdominal   Peds  Hematology negative hematology ROS (+)   Anesthesia Other Findings Past Medical History: No date: Anxiety No date: Hypertension No date: Panic disorder  History reviewed. No pertinent surgical history.  BMI    Body Mass Index: 29.01 kg/m      Reproductive/Obstetrics negative OB ROS                             Anesthesia Physical Anesthesia Plan  ASA: 2  Anesthesia Plan: General   Post-op Pain Management: Minimal or no pain anticipated   Induction: Intravenous  PONV Risk Score and Plan: 3 and Propofol infusion, TIVA and Ondansetron  Airway Management Planned: Nasal Cannula  Additional Equipment: None  Intra-op Plan:   Post-operative Plan:   Informed Consent: I have reviewed the patients History and Physical, chart, labs and discussed the procedure including the risks, benefits and alternatives for the proposed anesthesia with the patient or authorized representative who has indicated his/her understanding and acceptance.     Dental advisory given  Plan Discussed with: CRNA and Surgeon  Anesthesia Plan Comments: (Discussed risks of  anesthesia with patient, including possibility of difficulty with spontaneous ventilation under anesthesia necessitating airway intervention, PONV, and rare risks such as cardiac or respiratory or neurological events, and allergic reactions. Discussed the role of CRNA in patient's perioperative care. Patient understands.)       Anesthesia Quick Evaluation

## 2023-04-21 NOTE — Transfer of Care (Signed)
Immediate Anesthesia Transfer of Care Note  Patient: [name]  Procedure(s) Performed: COLONOSCOPY WITH PROPOFOL ESOPHAGOGASTRODUODENOSCOPY (EGD) WITH PROPOFOL  Patient Location: PACU and Endoscopy Unit  Anesthesia Type:General  Level of Consciousness: awake  Airway & Oxygen Therapy: Patient Spontanous Breathing  Post-op Assessment: Report given to RN and Post -op Vital signs reviewed and stable  Post vital signs: Reviewed and stable  Last Vitals:  Vitals Value Taken Time  BP 112/67 04/21/23 0816  Temp    Pulse 79 04/21/23 0817  Resp 17 04/21/23 0817  SpO2 100 % 04/21/23 0817  Vitals shown include unfiled device data.  Last Pain:  Vitals:   04/21/23 0716  TempSrc: Temporal  PainSc: 0-No pain         Complications: No notable events documented.

## 2023-04-21 NOTE — Anesthesia Procedure Notes (Signed)
Procedure Name: MAC Date/Time: 04/21/2023 7:53 AM  Performed by: Cheral Bay, CRNAPre-anesthesia Checklist: Patient identified, Emergency Drugs available, Suction available, Patient being monitored and Timeout performed Patient Re-evaluated:Patient Re-evaluated prior to induction Oxygen Delivery Method: Nasal cannula Induction Type: IV induction Placement Confirmation: positive ETCO2 and CO2 detector

## 2023-04-21 NOTE — Op Note (Addendum)
[facility] Gastroenterology Patient Name: [name] Procedure Date: 04/21/2023 7:51 AM MRN: [id number] Account #: [id number] Date of Birth: 01/31/1976 Admit Type: Outpatient Age: 47 Room: [facility] ENDO ROOM 3 Gender: Female Note Status: Finalized Instrument Name: Prentice Docker [id number] Procedure:             Colonoscopy Indications:           Screening for colorectal malignant neoplasm Providers:             Wyline Mood MD, MD Referring MD:          Wyline Mood MD, MD (Referring MD), Sherrie Mustache, MD                         (Referring MD) Medicines:             Monitored Anesthesia Care Complications:         No immediate complications. Procedure:             Pre-Anesthesia Assessment:                        - Prior to the procedure, a History and Physical was                         performed, and patient medications, allergies and                         sensitivities were reviewed. The patient's tolerance                         of previous anesthesia was reviewed.                        - The risks and benefits of the procedure and the                         sedation options and risks were discussed with the                         patient. All questions were answered and informed                         consent was obtained.                        - ASA Grade Assessment: II - A patient with mild                         systemic disease.                        After obtaining informed consent, the colonoscope was                         passed under direct vision. Throughout the procedure,                         the patient's blood pressure, pulse, and oxygen                         saturations were  monitored continuously. The                         Colonoscope was introduced through the anus and                         advanced to the the cecum, identified by the                         appendiceal orifice. The colonoscopy was performed                          with ease. The patient tolerated the procedure well.                         The quality of the bowel preparation was excellent.                         The ileocecal valve, appendiceal orifice, and rectum                         were photographed. Findings:      The perianal and digital rectal examinations were normal.      The entire examined colon appeared normal on direct and retroflexion       views.      A 3 mm polyp was found in the cecum. The polyp was sessile. The polyp       was removed with a cold biopsy forceps. Resection and retrieval were       complete. Impression:            - The entire examined colon is normal on direct and                         retroflexion views.                        - No specimens collected. Recommendation:        - Discharge patient to home (with escort).                        - Resume previous diet.                        - Continue present medications.                        - Repeat colonoscopy in 10 years for screening                         purposes. Procedure Code(s):     --- Professional ---                        [phone], Colonoscopy, flexible; with biopsy, single or                         multiple Diagnosis Code(s):     --- Professional ---                        Z12.11, Encounter for screening for malignant  neoplasm                         of colon CPT copyright 2022 American Medical Association. All rights reserved. The codes documented in this report are preliminary and upon coder review may  be revised to meet current compliance requirements. Wyline Mood, MD Wyline Mood MD, MD 04/21/2023 8:13:20 AM This report has been signed electronically. Number of Addenda: 0 Note Initiated On: 04/21/2023 7:51 AM Scope Withdrawal Time: 0 hours 7 minutes 52 seconds  Total Procedure Duration: 0 hours 10 minutes 58 seconds  Estimated Blood Loss:  Estimated blood loss: none.      [facility]

## 2023-04-22 ENCOUNTER — Encounter: Payer: Self-pay | Admitting: Gastroenterology

## 2023-05-12 ENCOUNTER — Encounter: Payer: Self-pay | Admitting: Gastroenterology

## 2023-09-14 ENCOUNTER — Other Ambulatory Visit (HOSPITAL_COMMUNITY)
Admission: RE | Admit: 2023-09-14 | Discharge: 2023-09-14 | Disposition: A | Discharge status: Home / Self Care | Payer: MEDICAID | Source: Ambulatory Visit | Attending: Licensed Practical Nurse | Admitting: Licensed Practical Nurse

## 2023-09-14 ENCOUNTER — Ambulatory Visit (INDEPENDENT_AMBULATORY_CARE_PROVIDER_SITE_OTHER): Payer: MEDICAID | Admitting: Licensed Practical Nurse

## 2023-09-14 VITALS — BP 136/90 | HR 91 | Wt 173.9 lb

## 2023-09-14 DIAGNOSIS — Z01419 Encounter for gynecological examination (general) (routine) without abnormal findings: Secondary | ICD-10-CM | POA: Insufficient documentation

## 2023-09-14 DIAGNOSIS — Z1231 Encounter for screening mammogram for malignant neoplasm of breast: Secondary | ICD-10-CM

## 2023-09-14 DIAGNOSIS — Z124 Encounter for screening for malignant neoplasm of cervix: Secondary | ICD-10-CM | POA: Insufficient documentation

## 2023-09-14 DIAGNOSIS — N898 Other specified noninflammatory disorders of vagina: Secondary | ICD-10-CM | POA: Insufficient documentation

## 2023-09-14 DIAGNOSIS — Z13220 Encounter for screening for lipoid disorders: Secondary | ICD-10-CM

## 2023-09-14 DIAGNOSIS — Z131 Encounter for screening for diabetes mellitus: Secondary | ICD-10-CM

## 2023-09-14 DIAGNOSIS — Z113 Encounter for screening for infections with a predominantly sexual mode of transmission: Secondary | ICD-10-CM

## 2023-09-14 NOTE — Assessment & Plan Note (Addendum)
-  Pap and STI testing done today. -Will order mammogram- discussed routine health screenings (mammogram yearly, colonoscopy every 10 years).  -Routine wellness labs ordered (CBC, Lipid panel, Hgb A1C)

## 2023-09-14 NOTE — Assessment & Plan Note (Addendum)
Vaginitis and STI screening done this visit- results pending.

## 2023-09-14 NOTE — Progress Notes (Signed)
Outpatient Gynecology Note: Annual Visit  Assessment/Plan:    [name] is a 48 y.o. female G2P0 with normal well-woman gynecologic exam.   Vaginal discharge Vaginitis and STI screening done this visit- results pending.   Well woman exam with routine gynecological exam -Pap and STI testing done today. -Will order mammogram- discussed routine health screenings (mammogram yearly, colonoscopy every 10 years).  -Routine wellness labs ordered (CBC, Lipid panel, Hgb A1C)     Orders Placed This Encounter  Procedures   CBC   Lipid panel   Hemoglobin A1c   HEP, RPR, HIV Panel   Current Outpatient Medications  Medication Instructions   ferrous sulfate 325 mg, Daily with breakfast   omeprazole (PRILOSEC) 20 MG capsule TAKE 1 CAPSULE BY MOUTH ONCE DAILY 30 MINS BEFORE BREAKFAST *TAKE ON AN EMPTY STOMACH* *DO NOT CRUSH OR CHEW*   QUEtiapine (SEROQUEL) 300 mg, Daily at bedtime   QUEtiapine (SEROQUEL) 300 mg, Daily at bedtime   traZODone (DESYREL) 50 mg, Daily at bedtime    No follow-ups on file.    Subjective:    [name] is a 48 y.o. female G2P0 who presents for annual wellness visit. She reports noticing vaginal discharge with odor that is clear, white, and milky in color and consistency that has been going on for about a year.   Lives in [facility] group care. Here with [name], who is a caretaker but is out in the car.      Well Woman Visit:  GYN HISTORY:  LMP 09/06/2023 Menstrual History: OB History     Gravida  2   Para      Term      Preterm      AB      Living  2      SAB      IAB      Ectopic      Multiple      Live Births              Period Cycle (Days): 28 Period Duration (Days): 5 Period Pattern: Regular Menstrual Flow: Light Dysmenorrhea: (!) Mild Dysmenorrhea Symptoms: Cramping   Periods are regular, every 28 days and last around 5 days.  Dysmenorrhea: mild cramping  Intermenstrual bleeding, spotting, or discharge?  no Urinary incontinence? no  Sexually active: no Number of sexual partners: 0 Gender of sexual Partners: reports she has not had intercourse in "a couple of years" Dyspareunia? None in the past Last pap: 2022- NILM History of abnormal Pap: no Gardasil series:  unknown STI history: unknown STI/HIV testing or immunizations needed? Yes.    Contraceptive methods: bilateral tubal ligation  Health Maintenance > Reviewed breast self-awareness > History of abnormal mammogram: she is unsure  > Body mass index is 29.85 kg/m.  > Recent dental visit Yes.   > Seat Belt Use: Yes.   > Texting and driving? No. > Guns in the house No. > Concern for alcohol abuse? no   Tobacco or other drug use: Denies drug use  Tobacco Use: High Risk (04/21/2023)   Patient History    Smoking Tobacco Use: Every Day    Smokeless Tobacco Use: Never    Passive Exposure: Not on file  Tobacco cessation counseling provided.    If >40:  Last mammogram: she is unsure  Age at menopause: n/a  Last colonoscopy: 2024 Last DEXA: n/a  Last lipid screening: she is unsure   _________________________________________________________  Current Outpatient Medications  Medication Sig Dispense  Refill   ferrous sulfate 325 (65 FE) MG tablet Take 325 mg by mouth daily with breakfast.     omeprazole (PRILOSEC) 20 MG capsule TAKE 1 CAPSULE BY MOUTH ONCE DAILY 30 MINS BEFORE BREAKFAST *TAKE ON AN EMPTY STOMACH* *DO NOT CRUSH OR CHEW* 20 capsule 10   QUEtiapine (SEROQUEL) 300 MG tablet Take 300 mg by mouth at bedtime.     QUEtiapine (SEROQUEL) 300 MG tablet Take 300 mg by mouth at bedtime.     traZODone (DESYREL) 50 MG tablet Take 50 mg by mouth at bedtime. (Patient not taking: Reported on 09/14/2023)     No current facility-administered medications for this visit.   Allergies  Allergen Reactions   Penicillins Nausea And Vomiting    Past Medical History:  Diagnosis Date   Anxiety    Hypertension    Panic disorder     Past Surgical History:  Procedure Laterality Date   COLONOSCOPY WITH PROPOFOL N/A 04/21/2023   Procedure: COLONOSCOPY WITH PROPOFOL;  Surgeon: Wyline Mood, MD;  Location: [facility] ENDOSCOPY;  Service: Gastroenterology;  Laterality: N/A;   ESOPHAGOGASTRODUODENOSCOPY (EGD) WITH PROPOFOL N/A 04/21/2023   Procedure: ESOPHAGOGASTRODUODENOSCOPY (EGD) WITH PROPOFOL;  Surgeon: Wyline Mood, MD;  Location: [facility] ENDOSCOPY;  Service: Gastroenterology;  Laterality: N/A;   POLYPECTOMY  04/21/2023   Procedure: POLYPECTOMY;  Surgeon: Wyline Mood, MD;  Location: [facility] ENDOSCOPY;  Service: Gastroenterology;;   OB History     Gravida  2   Para      Term      Preterm      AB      Living  2      SAB      IAB      Ectopic      Multiple      Live Births             Social History   Tobacco Use   Smoking status: Every Day   Smokeless tobacco: Never  Substance Use Topics   Alcohol use: No   Social History   Substance and Sexual Activity  Sexual Activity Not Currently   Birth control/protection: Surgical   Comment: BTL     There is no immunization history on file for this patient.   Review Of Systems  Constitutional: Denied constitutional symptoms, night sweats, recent illness, fatigue, fever, insomnia and weight loss.  Eyes: Denied eye symptoms, eye pain, photophobia, vision change and visual disturbance.  Ears/Nose/Throat/Neck: Denied ear, nose, throat or neck symptoms, hearing loss, nasal discharge, sinus congestion and sore throat.  Cardiovascular: Denied cardiovascular symptoms, arrhythmia, chest pain/pressure, edema, exercise intolerance, orthopnea and palpitations.  Respiratory: Denied pulmonary symptoms, asthma, pleuritic pain, productive sputum, cough, dyspnea and wheezing.  Gastrointestinal: Denied, gastro-esophageal reflux, melena, nausea and vomiting.  Genitourinary: Denied genitourinary symptoms including pelvic relaxation issues, and urinary complaints. + vaginal  discharge   Musculoskeletal: Denied musculoskeletal symptoms, stiffness, swelling, muscle weakness and myalgia.  Dermatologic: Denied dermatology symptoms, rash and scar.  Neurologic: Denied neurology symptoms, dizziness, headache, neck pain and syncope.  Psychiatric: Denied psychiatric symptoms, anxiety and depression.  Endocrine: Denied endocrine symptoms including hot flashes and night sweats.      Objective:    BP (!) 136/90 (BP Location: Left Arm, Patient Position: Sitting, Cuff Size: Normal)   Pulse 91   Wt 78.9 kg   BMI 29.85 kg/m   Constitutional: Well-developed, well-nourished female in no acute distress Neurological: Alert and oriented to person, place, and time Psychiatric: Mood and affect appropriate Skin: No  rashes or lesions Neck: Supple without masses. Trachea is midline.Thyroid is normal size without masses Respiratory: Clear to auscultation bilaterally. Good air movement with normal work of breathing. Cardiovascular: Regular rate and rhythm. Extremities grossly normal, nontender with no edema; pulses regular Gastrointestinal: Soft, nontender, nondistended. No masses or hernias appreciated. No hepatosplenomegaly. No fluid wave. No rebound or guarding. Breast Exam: normal appearance, no masses or tenderness Genitourinary:         External Genitalia: Normal female genitalia    Vagina: well estrogenated, no lesions.    Cervix: No lesions, normal size and consistency; no cervical motion tenderness     Uterus: Normal size and contour; smooth, mobile, NT, midposition. Adnexae: Non-palpable and non-tender Perineum/Anus: No lesions Rectal: deferred   Cindra Eves, SNM [name], CNM present for all portions of care.  09/14/23 12:58 PM

## 2023-09-15 ENCOUNTER — Other Ambulatory Visit: Payer: Self-pay | Admitting: Licensed Practical Nurse

## 2023-09-15 DIAGNOSIS — B9689 Other specified bacterial agents as the cause of diseases classified elsewhere: Secondary | ICD-10-CM

## 2023-09-15 LAB — LIPID PANEL
Chol/HDL Ratio: 3.6 {ratio} (ref 0.0–4.4)
Cholesterol, Total: 192 mg/dL (ref 100–199)
HDL: 54 mg/dL (ref >39)
LDL Chol Calc (NIH): 105 mg/dL — ABNORMAL HIGH (ref 0–99)
Triglycerides: 190 mg/dL — ABNORMAL HIGH (ref 0–149)
VLDL Cholesterol Cal: 33 mg/dL (ref 5–40)

## 2023-09-15 LAB — CBC
Hematocrit: 39.7 % (ref 34.0–46.6)
Hemoglobin: 12.7 g/dL (ref 11.1–15.9)
MCH: 31.2 pg (ref 26.6–33.0)
MCHC: 32 g/dL (ref 31.5–35.7)
MCV: 98 fL — ABNORMAL HIGH (ref 79–97)
Platelets: 245 10*3/uL (ref 150–450)
RBC: 4.07 x10E6/uL (ref 3.77–5.28)
RDW: 12.7 % (ref 11.7–15.4)
WBC: 5.8 10*3/uL (ref 3.4–10.8)

## 2023-09-15 LAB — CERVICOVAGINAL ANCILLARY ONLY
Bacterial Vaginitis (gardnerella): POSITIVE — AB
Candida Glabrata: NEGATIVE
Candida Vaginitis: NEGATIVE
Chlamydia: NEGATIVE
Comment: NEGATIVE
Comment: NEGATIVE
Comment: NEGATIVE
Comment: NEGATIVE
Comment: NEGATIVE
Comment: NORMAL
Neisseria Gonorrhea: NEGATIVE
Trichomonas: NEGATIVE

## 2023-09-15 LAB — HEMOGLOBIN A1C
Est. average glucose Bld gHb Est-mCnc: 123 mg/dL
Hgb A1c MFr Bld: 5.9 % — ABNORMAL HIGH (ref 4.8–5.6)

## 2023-09-15 MED ORDER — METRONIDAZOLE 500 MG PO TABS
500.0000 mg | ORAL_TABLET | Freq: Two times a day (BID) | ORAL | 0 refills | Status: AC
Start: 2023-09-15 — End: ?

## 2023-09-15 NOTE — Progress Notes (Signed)
TC to [place], pt's caretaker Reviewed lab results Her Cholesterol and HA1C are a little abnormal, diet and exercise can improves these numbers, she should follow up with a PCP.  [name] has BV, this is not a STI, but we do treat it with an antibiotic. Please make sure she does not use Alcohol while on this medication.  The PAP and HIV, RPR and HBV are pending, I will call if this are any concerns.  [name] verbalized unstanding [name], CNM   [facility]  09/15/23  12:46 PM

## 2023-09-17 LAB — CYTOLOGY - PAP
Comment: NEGATIVE
Diagnosis: NEGATIVE
High risk HPV: NEGATIVE

## 2023-10-07 LAB — HEP, RPR, HIV PANEL
HIV Screen 4th Generation wRfx: NONREACTIVE
Hepatitis B Surface Ag: NEGATIVE
RPR Ser Ql: NONREACTIVE

## 2023-10-07 LAB — SPECIMEN STATUS REPORT

## 2024-03-07 ENCOUNTER — Other Ambulatory Visit: Payer: Self-pay | Admitting: Physician Assistant

## 2024-03-09 ENCOUNTER — Other Ambulatory Visit: Payer: Self-pay | Admitting: Internal Medicine

## 2024-04-13 ENCOUNTER — Encounter: Payer: Self-pay | Admitting: Internal Medicine

## 2024-04-13 ENCOUNTER — Ambulatory Visit: Payer: MEDICAID | Admitting: Internal Medicine

## 2024-04-13 VITALS — BP 122/80 | HR 95 | Ht 64.0 in | Wt 174.6 lb

## 2024-04-13 DIAGNOSIS — N898 Other specified noninflammatory disorders of vagina: Secondary | ICD-10-CM

## 2024-04-13 DIAGNOSIS — E038 Other specified hypothyroidism: Secondary | ICD-10-CM | POA: Insufficient documentation

## 2024-04-13 DIAGNOSIS — I10 Essential (primary) hypertension: Secondary | ICD-10-CM | POA: Insufficient documentation

## 2024-04-13 DIAGNOSIS — K219 Gastro-esophageal reflux disease without esophagitis: Secondary | ICD-10-CM | POA: Diagnosis not present

## 2024-04-13 DIAGNOSIS — Z1231 Encounter for screening mammogram for malignant neoplasm of breast: Secondary | ICD-10-CM | POA: Insufficient documentation

## 2024-04-13 DIAGNOSIS — E782 Mixed hyperlipidemia: Secondary | ICD-10-CM | POA: Insufficient documentation

## 2024-04-13 DIAGNOSIS — R7303 Prediabetes: Secondary | ICD-10-CM | POA: Insufficient documentation

## 2024-04-13 DIAGNOSIS — F259 Schizoaffective disorder, unspecified: Secondary | ICD-10-CM

## 2024-04-13 DIAGNOSIS — F3132 Bipolar disorder, current episode depressed, moderate: Secondary | ICD-10-CM

## 2024-04-13 NOTE — Progress Notes (Signed)
 New Patient Office Visit  Subjective   Patient ID: [name], female    DOB: 1975-12-15  Age: 48 y.o. MRN: [id number]  CC:  Chief Complaint  Patient presents with   Establish Care    NPE    HPI [name] presents to establish care Previous Primary Care provider/office:   she does not have additional concerns to discuss today.   Patient seen today for a new patient appointment. She is accompanied by D. Hattfield, AD from her community care home as a chaperone. She sees Clinical cytogeneticist for Psychiatric care and monthly Abilify 400 mg injections. D. Hattifled reports her daily fasting blood glucose levels run between 89-115, with an occasional reading in 130s. Her last HbgA1c was 6% in 01/2024. She had a negative pap smear in 08/2023, her last colonoscopy was 04/2023. She has never had a mammogram so we will order one for breast cancer screening. Patient is unsure of her family cancer history. She has a history of GERD and takes Omeprazole 20 mg once daily in the morning; she reports occasional symptoms depending on what she eats. She was recently retreated with Flagyl  for vaginal discharge by another provider; patient reports symptoms resolved and completed medication as prescribed.Patient endorses smoking tobacco; reports smoking 1.5 cigarettes a day. She is not due for labs so we will make no medication changes at this time and order routine labs at her next visit.    Outpatient Encounter Medications as of 04/13/2024  Medication Sig   ACCU-CHEK GUIDE TEST test strip    ARIPiprazole ER (ABILIFY MAINTENA) 400 MG PRSY prefilled syringe Inject 400 mg into the muscle every 28 (twenty-eight) days.   buPROPion (WELLBUTRIN XL) 300 MG 24 hr tablet Take 300 mg by mouth daily.   divalproex (DEPAKOTE ER) 500 MG 24 hr tablet Take 1,000 mg by mouth daily.   escitalopram (LEXAPRO) 20 MG tablet Take 20 mg by mouth daily.   hydrochlorothiazide (HYDRODIURIL) 25 MG tablet Take 25 mg by mouth  daily.   INGREZZA 80 MG capsule Take 80 mg by mouth daily.   levothyroxine (SYNTHROID) 50 MCG tablet Take 50 mcg by mouth daily.   lovastatin (MEVACOR) 20 MG tablet TAKE 1 TABLET BY MOUTH ONCE DAILY   metroNIDAZOLE  (FLAGYL ) 500 MG tablet Take 1 tablet (500 mg total) by mouth 2 (two) times daily.   omeprazole (PRILOSEC) 20 MG capsule TAKE 1 CAPSULE BY MOUTH ONCE DAILY 30 MINS BEFORE BREAKFAST *TAKE ON AN EMPTY STOMACH* *DO NOT CRUSH OR CHEW*   ferrous sulfate 325 (65 FE) MG tablet Take 325 mg by mouth daily with breakfast. (Patient not taking: Reported on 04/13/2024)   QUEtiapine (SEROQUEL) 300 MG tablet Take 300 mg by mouth at bedtime. (Patient not taking: Reported on 04/13/2024)   QUEtiapine (SEROQUEL) 300 MG tablet Take 300 mg by mouth at bedtime. (Patient not taking: Reported on 04/13/2024)   traZODone (DESYREL) 50 MG tablet Take 50 mg by mouth at bedtime. (Patient not taking: Reported on 04/13/2024)   No facility-administered encounter medications on file as of 04/13/2024.    Past Medical History:  Diagnosis Date   Anxiety    Hypertension    Panic disorder     Past Surgical History:  Procedure Laterality Date   COLONOSCOPY WITH PROPOFOL  N/A 04/21/2023   Procedure: COLONOSCOPY WITH PROPOFOL ;  Surgeon: Therisa Bi, MD;  Location: [facility] ENDOSCOPY;  Service: Gastroenterology;  Laterality: N/A;   ESOPHAGOGASTRODUODENOSCOPY (EGD) WITH PROPOFOL  N/A 04/21/2023   Procedure: ESOPHAGOGASTRODUODENOSCOPY (EGD)  WITH PROPOFOL ;  Surgeon: Therisa Bi, MD;  Location: [facility] ENDOSCOPY;  Service: Gastroenterology;  Laterality: N/A;   POLYPECTOMY  04/21/2023   Procedure: POLYPECTOMY;  Surgeon: Therisa Bi, MD;  Location: [facility] ENDOSCOPY;  Service: Gastroenterology;;    History reviewed. No pertinent family history.  Social History   Socioeconomic History   Marital status: Single    Spouse name: Not on file   Number of children: Not on file   Years of education: Not on file   Highest education level: Not on  file  Occupational History   Not on file  Tobacco Use   Smoking status: Every Day   Smokeless tobacco: Never  Vaping Use   Vaping status: Some Days   Substances: Nicotine, Flavoring  Substance and Sexual Activity   Alcohol use: No   Drug use: No   Sexual activity: Not Currently    Birth control/protection: Surgical    Comment: BTL  Other Topics Concern   Not on file  Social History Narrative   Not on file   Social Drivers of Health   Financial Resource Strain: Not on file  Food Insecurity: Not on file  Transportation Needs: Not on file  Physical Activity: Not on file  Stress: Not on file  Social Connections: Not on file  Intimate Partner Violence: Not on file    Review of Systems  Constitutional: Negative.  Negative for chills, fever and malaise/fatigue.  HENT: Negative.    Eyes: Negative.   Respiratory: Negative.  Negative for cough and shortness of breath.   Cardiovascular: Negative.  Negative for chest pain, palpitations and leg swelling.  Gastrointestinal:  Positive for heartburn. Negative for abdominal pain, constipation, diarrhea, nausea and vomiting.  Genitourinary: Negative.  Negative for dysuria and flank pain.  Musculoskeletal: Negative.  Negative for joint pain and myalgias.  Skin: Negative.   Neurological: Negative.  Negative for dizziness and headaches.  Endo/Heme/Allergies: Negative.   Psychiatric/Behavioral: Negative.  Negative for depression and suicidal ideas. The patient is not nervous/anxious.         Objective   BP 122/80   Pulse 95   Ht 5' 4 (1.626 m)   Wt 174 lb 9.6 oz (79.2 kg)   SpO2 98%   BMI 29.97 kg/m   Physical Exam Vitals and nursing note reviewed.  Constitutional:      Appearance: Normal appearance.  HENT:     Head: Normocephalic and atraumatic.     Nose: Nose normal.     Mouth/Throat:     Mouth: Mucous membranes are moist.     Pharynx: Oropharynx is clear.  Eyes:     Conjunctiva/sclera: Conjunctivae normal.      Pupils: Pupils are equal, round, and reactive to light.  Cardiovascular:     Rate and Rhythm: Normal rate and regular rhythm.     Pulses: Normal pulses.     Heart sounds: Murmur heard.  Pulmonary:     Effort: Pulmonary effort is normal.     Breath sounds: Normal breath sounds. No wheezing.  Abdominal:     General: Bowel sounds are normal.     Palpations: Abdomen is soft.     Tenderness: There is no abdominal tenderness. There is no right CVA tenderness or left CVA tenderness.  Musculoskeletal:        General: Normal range of motion.     Cervical back: Normal range of motion.     Right lower leg: No edema.     Left lower leg: No edema.  Skin:    General: Skin is warm and dry.  Neurological:     General: No focal deficit present.     Mental Status: She is alert and oriented to person, place, and time.  Psychiatric:        Mood and Affect: Mood normal.        Behavior: Behavior normal.        Assessment & Plan:  Continue current medications as prescribed. Will order mammogram for breast cancer screening. Encouraged healthy diet and exercise as tolerated.  Problem List Items Addressed This Visit     Schizoaffective disorder (HCC)   Gastroesophageal reflux disease   Vaginal discharge   Breast cancer screening by mammogram - Primary   Relevant Orders   MM 3D SCREENING MAMMOGRAM BILATERAL BREAST    Return in about 2 months (around 06/13/2024).   Total time spent: 30 minutes  FERNAND FREDY RAMAN, MD  04/13/2024   This document may have been prepared by [facility] Voice Recognition software and as such may include unintentional dictation errors.

## 2024-04-18 ENCOUNTER — Other Ambulatory Visit: Payer: Self-pay | Admitting: Internal Medicine

## 2024-04-19 ENCOUNTER — Other Ambulatory Visit: Payer: Self-pay

## 2024-04-20 MED ORDER — LEVOTHYROXINE SODIUM 50 MCG PO TABS: 50.0000 ug | ORAL_TABLET | Freq: Every day | ORAL | 1 refills | Status: DC

## 2024-05-05 ENCOUNTER — Other Ambulatory Visit: Payer: Self-pay | Admitting: Internal Medicine

## 2024-05-31 ENCOUNTER — Other Ambulatory Visit: Payer: Self-pay | Admitting: Internal Medicine

## 2024-06-13 ENCOUNTER — Encounter: Payer: Self-pay | Admitting: Internal Medicine

## 2024-06-13 ENCOUNTER — Ambulatory Visit: Payer: MEDICAID | Admitting: Internal Medicine

## 2024-06-13 VITALS — BP 128/86 | HR 83 | Ht 64.0 in | Wt 177.2 lb

## 2024-06-13 DIAGNOSIS — Z23 Encounter for immunization: Secondary | ICD-10-CM | POA: Diagnosis not present

## 2024-06-13 DIAGNOSIS — E782 Mixed hyperlipidemia: Secondary | ICD-10-CM

## 2024-06-13 DIAGNOSIS — F259 Schizoaffective disorder, unspecified: Secondary | ICD-10-CM

## 2024-06-13 DIAGNOSIS — K219 Gastro-esophageal reflux disease without esophagitis: Secondary | ICD-10-CM | POA: Diagnosis not present

## 2024-06-13 DIAGNOSIS — E038 Other specified hypothyroidism: Secondary | ICD-10-CM

## 2024-06-13 DIAGNOSIS — R7303 Prediabetes: Secondary | ICD-10-CM

## 2024-06-13 DIAGNOSIS — I10 Essential (primary) hypertension: Secondary | ICD-10-CM | POA: Diagnosis not present

## 2024-06-13 NOTE — Progress Notes (Signed)
 Established Patient Office Visit  Subjective:  Patient ID: [name], female    DOB: 1975-09-22  Age: 48 y.o. MRN: [id number]  Chief Complaint  Patient presents with   Follow-up    2 month follow up    Patient comes in for follow up ,accompanied by her Social Worker from [facility]. Her mammogram is still pending, will get labs today. She continues to smoke 1.5 ppd, and is not ready to quit yet-  She offers no new complaints, will get flu shot today. However there are concerns that patient is having increased sexual desire and the group home manager is concerned about patient's safety due to her Hypersexuality. Reportedly she goes through this phase about once a year , it is usually short and it resolves spontaneously. This time she is more aggressive. Patient has regular menstrual cycles, has BTL, and had a Negative PAP by ob/gyn in 08/2023. Patient continues to smoke heavily, risk of blood clots with OCP. Suspect her Schizoaffective disorder contributing to her current state also. Advised to contact the psychiatry team for advise and to adjust her meds.Meanwhile group home should insure safety of patient and co- residents.    No other concerns at this time.   Past Medical History:  Diagnosis Date   Anxiety    Hypertension    Panic disorder     Past Surgical History:  Procedure Laterality Date   COLONOSCOPY WITH PROPOFOL  N/A 04/21/2023   Procedure: COLONOSCOPY WITH PROPOFOL ;  Surgeon: Therisa Bi, MD;  Location: [facility] ENDOSCOPY;  Service: Gastroenterology;  Laterality: N/A;   ESOPHAGOGASTRODUODENOSCOPY (EGD) WITH PROPOFOL  N/A 04/21/2023   Procedure: ESOPHAGOGASTRODUODENOSCOPY (EGD) WITH PROPOFOL ;  Surgeon: Therisa Bi, MD;  Location: [facility] ENDOSCOPY;  Service: Gastroenterology;  Laterality: N/A;   POLYPECTOMY  04/21/2023   Procedure: POLYPECTOMY;  Surgeon: Therisa Bi, MD;  Location: [facility] ENDOSCOPY;  Service: Gastroenterology;;    Social History   Socioeconomic History   Marital  status: Single    Spouse name: Not on file   Number of children: Not on file   Years of education: Not on file   Highest education level: Not on file  Occupational History   Not on file  Tobacco Use   Smoking status: Every Day   Smokeless tobacco: Never  Vaping Use   Vaping status: Some Days   Substances: Nicotine, Flavoring  Substance and Sexual Activity   Alcohol use: No   Drug use: No   Sexual activity: Not Currently    Birth control/protection: Surgical    Comment: BTL  Other Topics Concern   Not on file  Social History Narrative   Not on file   Social Drivers of Health   Financial Resource Strain: Not on file  Food Insecurity: Not on file  Transportation Needs: Not on file  Physical Activity: Not on file  Stress: Not on file  Social Connections: Not on file  Intimate Partner Violence: Not on file    History reviewed. No pertinent family history.  Allergies  Allergen Reactions   Penicillins Nausea And Vomiting    Outpatient Medications Prior to Visit  Medication Sig   ACCU-CHEK GUIDE TEST test strip    Alcohol Swabs (ALCOHOL PREP) PADS USE 1 PAD TO PREP AREA BEFORE LANCING TO TEST BLOOD SUGAR ONCE DAILY   ARIPiprazole ER (ABILIFY MAINTENA) 400 MG PRSY prefilled syringe Inject 400 mg into the muscle every 28 (twenty-eight) days.   buPROPion (WELLBUTRIN XL) 300 MG 24 hr tablet Take 300 mg by mouth daily.  divalproex (DEPAKOTE ER) 500 MG 24 hr tablet Take 1,000 mg by mouth daily.   escitalopram (LEXAPRO) 20 MG tablet Take 20 mg by mouth daily.   hydrochlorothiazide (HYDRODIURIL) 25 MG tablet TAKE 1 TABLET BY MOUTH ONCE DAILY   INGREZZA 80 MG capsule Take 80 mg by mouth daily.   levothyroxine  (SYNTHROID ) 50 MCG tablet Take 1 tablet (50 mcg total) by mouth daily.   lovastatin (MEVACOR) 20 MG tablet TAKE 1 TABLET BY MOUTH ONCE DAILY   omeprazole (PRILOSEC) 20 MG capsule TAKE 1 CAPSULE BY MOUTH ONCE DAILY 30 MINS BEFORE BREAKFAST *TAKE ON AN EMPTY STOMACH* *DO NOT  CRUSH OR CHEW*   ferrous sulfate 325 (65 FE) MG tablet Take 325 mg by mouth daily with breakfast. (Patient not taking: Reported on 06/13/2024)   metroNIDAZOLE  (FLAGYL ) 500 MG tablet Take 1 tablet (500 mg total) by mouth 2 (two) times daily. (Patient not taking: Reported on 06/13/2024)   QUEtiapine (SEROQUEL) 300 MG tablet Take 300 mg by mouth at bedtime. (Patient not taking: Reported on 06/13/2024)   QUEtiapine (SEROQUEL) 300 MG tablet Take 300 mg by mouth at bedtime. (Patient not taking: Reported on 06/13/2024)   traZODone (DESYREL) 50 MG tablet Take 50 mg by mouth at bedtime. (Patient not taking: Reported on 06/13/2024)   No facility-administered medications prior to visit.    Review of Systems  Constitutional: Negative.  Negative for chills, fever and malaise/fatigue.  HENT: Negative.  Negative for congestion and sore throat.   Eyes: Negative.  Negative for blurred vision and pain.  Respiratory: Negative.  Negative for cough and shortness of breath.   Cardiovascular: Negative.  Negative for chest pain, palpitations and leg swelling.  Gastrointestinal: Negative.  Negative for abdominal pain, blood in stool, constipation, diarrhea, heartburn, melena, nausea and vomiting.  Genitourinary: Negative.  Negative for dysuria, flank pain, frequency and urgency.  Musculoskeletal: Negative.  Negative for joint pain and myalgias.  Skin: Negative.   Neurological: Negative.  Negative for dizziness, tingling, sensory change, weakness and headaches.  Endo/Heme/Allergies: Negative.   Psychiatric/Behavioral: Negative.  Negative for depression and suicidal ideas. The patient is not nervous/anxious.        Objective:   BP 128/86   Pulse 83   Ht 5' 4 (1.626 m)   Wt 177 lb 3.2 oz (80.4 kg)   SpO2 98%   BMI 30.42 kg/m   Vitals:   06/13/24 0947  BP: 128/86  Pulse: 83  Height: 5' 4 (1.626 m)  Weight: 177 lb 3.2 oz (80.4 kg)  SpO2: 98%  BMI (Calculated): 30.4    Physical Exam Vitals and  nursing note reviewed.  Constitutional:      Appearance: Normal appearance.  HENT:     Head: Normocephalic and atraumatic.     Nose: Nose normal.     Mouth/Throat:     Mouth: Mucous membranes are moist.     Pharynx: Oropharynx is clear.  Eyes:     Conjunctiva/sclera: Conjunctivae normal.     Pupils: Pupils are equal, round, and reactive to light.  Cardiovascular:     Rate and Rhythm: Normal rate and regular rhythm.     Pulses: Normal pulses.     Heart sounds: Normal heart sounds. No murmur heard. Pulmonary:     Effort: Pulmonary effort is normal.     Breath sounds: Normal breath sounds. No wheezing.  Abdominal:     General: Bowel sounds are normal.     Palpations: Abdomen is soft.     Tenderness:  There is no abdominal tenderness. There is no right CVA tenderness or left CVA tenderness.  Musculoskeletal:        General: Normal range of motion.     Cervical back: Normal range of motion.     Right lower leg: No edema.     Left lower leg: No edema.  Skin:    General: Skin is warm and dry.  Neurological:     General: No focal deficit present.     Mental Status: She is alert and oriented to person, place, and time.  Psychiatric:        Mood and Affect: Mood normal.        Behavior: Behavior normal.      No results found for any visits on 06/13/24.  No results found for this or any previous visit (from the past 2160 hours).    Assessment & Plan:  Check labs. Mammogram. Psychiatrist to address hypersexuality-and schizoaffective disorder. Problem List Items Addressed This Visit     Schizoaffective disorder (HCC)   Gastroesophageal reflux disease   Relevant Orders   CBC with Diff   Other specified hypothyroidism   Relevant Orders   TSH+T4F+T3Free   Mixed hyperlipidemia   Relevant Orders   Lipid Panel w/o Chol/HDL Ratio   Prediabetes   Relevant Orders   Hemoglobin A1c   Essential hypertension, benign - Primary   Relevant Orders   CMP14+EGFR   Other Visit  Diagnoses       Flu vaccine need       Relevant Orders   Influenza, MDCK, trivalent, PF(Flucelvax egg-free) (Completed)       Return in about 3 months (around 09/13/2024).   Total time spent: 30 minutes. This time includes review of previous notes and results and patient face to face interaction during today's visit.    FERNAND FREDY RAMAN, MD  06/13/2024   This document may have been prepared by [facility] Voice Recognition software and as such may include unintentional dictation errors.

## 2024-06-14 ENCOUNTER — Ambulatory Visit: Payer: Self-pay | Admitting: Internal Medicine

## 2024-06-14 DIAGNOSIS — E782 Mixed hyperlipidemia: Secondary | ICD-10-CM

## 2024-06-14 LAB — CBC WITH DIFFERENTIAL/PLATELET
Basophils Absolute: 0.1 x10E3/uL (ref 0.0–0.2)
Basos: 1 %
EOS (ABSOLUTE): 0.1 x10E3/uL (ref 0.0–0.4)
Eos: 2 %
Hematocrit: 37 % (ref 34.0–46.6)
Hemoglobin: 12.2 g/dL (ref 11.1–15.9)
Immature Grans (Abs): 0 x10E3/uL (ref 0.0–0.1)
Immature Granulocytes: 0 %
Lymphocytes Absolute: 2.8 x10E3/uL (ref 0.7–3.1)
Lymphs: 51 %
MCH: 30.1 pg (ref 26.6–33.0)
MCHC: 33 g/dL (ref 31.5–35.7)
MCV: 91 fL (ref 79–97)
Monocytes Absolute: 0.4 x10E3/uL (ref 0.1–0.9)
Monocytes: 7 %
Neutrophils Absolute: 2.1 x10E3/uL (ref 1.4–7.0)
Neutrophils: 39 %
Platelets: 235 x10E3/uL (ref 150–450)
RBC: 4.05 x10E6/uL (ref 3.77–5.28)
RDW: 12.2 % (ref 11.7–15.4)
WBC: 5.5 x10E3/uL (ref 3.4–10.8)

## 2024-06-14 LAB — TSH+T4F+T3FREE
Free T4: 1.43 ng/dL (ref 0.82–1.77)
T3, Free: 3.1 pg/mL (ref 2.0–4.4)
TSH: 1.43 u[IU]/mL (ref 0.450–4.500)

## 2024-06-14 LAB — CMP14+EGFR
ALT: 16 IU/L (ref 0–32)
AST: 23 IU/L (ref 0–40)
Albumin: 4.5 g/dL (ref 3.9–4.9)
Alkaline Phosphatase: 50 IU/L (ref 41–116)
BUN/Creatinine Ratio: 12 (ref 9–23)
BUN: 10 mg/dL (ref 6–24)
Bilirubin Total: 0.4 mg/dL (ref 0.0–1.2)
CO2: 24 mmol/L (ref 20–29)
Calcium: 9.6 mg/dL (ref 8.7–10.2)
Chloride: 95 mmol/L — ABNORMAL LOW (ref 96–106)
Creatinine, Ser: 0.85 mg/dL (ref 0.57–1.00)
Globulin, Total: 3.1 g/dL (ref 1.5–4.5)
Glucose: 89 mg/dL (ref 70–99)
Potassium: 4.7 mmol/L (ref 3.5–5.2)
Sodium: 137 mmol/L (ref 134–144)
Total Protein: 7.6 g/dL (ref 6.0–8.5)
eGFR: 85 mL/min/1.73 (ref >59)

## 2024-06-14 LAB — LIPID PANEL W/O CHOL/HDL RATIO
Cholesterol, Total: 199 mg/dL (ref 100–199)
HDL: 52 mg/dL (ref >39)
LDL Chol Calc (NIH): 128 mg/dL — ABNORMAL HIGH (ref 0–99)
Triglycerides: 107 mg/dL (ref 0–149)
VLDL Cholesterol Cal: 19 mg/dL (ref 5–40)

## 2024-06-14 LAB — HEMOGLOBIN A1C
Est. average glucose Bld gHb Est-mCnc: 126 mg/dL
Hgb A1c MFr Bld: 6 % — ABNORMAL HIGH (ref 4.8–5.6)

## 2024-06-14 MED ORDER — LOVASTATIN 40 MG PO TABS
40.0000 mg | ORAL_TABLET | Freq: Every day | ORAL | 3 refills | Status: AC
Start: 2024-06-14 — End: ?

## 2024-06-28 ENCOUNTER — Other Ambulatory Visit: Payer: Self-pay | Admitting: Internal Medicine

## 2024-09-14 ENCOUNTER — Ambulatory Visit: Payer: MEDICAID | Admitting: Internal Medicine

## 2024-09-22 ENCOUNTER — Ambulatory Visit: Payer: MEDICAID

## 2024-09-22 VITALS — BP 118/80 | HR 89 | Ht 64.0 in | Wt 180.0 lb

## 2024-09-22 DIAGNOSIS — R7303 Prediabetes: Secondary | ICD-10-CM

## 2024-09-22 DIAGNOSIS — F3132 Bipolar disorder, current episode depressed, moderate: Secondary | ICD-10-CM

## 2024-09-22 DIAGNOSIS — H66002 Acute suppurative otitis media without spontaneous rupture of ear drum, left ear: Secondary | ICD-10-CM | POA: Insufficient documentation

## 2024-09-22 DIAGNOSIS — E782 Mixed hyperlipidemia: Secondary | ICD-10-CM

## 2024-09-22 DIAGNOSIS — E66811 Obesity, class 1: Secondary | ICD-10-CM | POA: Insufficient documentation

## 2024-09-22 DIAGNOSIS — R051 Acute cough: Secondary | ICD-10-CM | POA: Insufficient documentation

## 2024-09-22 DIAGNOSIS — I10 Essential (primary) hypertension: Secondary | ICD-10-CM

## 2024-09-22 DIAGNOSIS — K219 Gastro-esophageal reflux disease without esophagitis: Secondary | ICD-10-CM

## 2024-09-22 DIAGNOSIS — F259 Schizoaffective disorder, unspecified: Secondary | ICD-10-CM

## 2024-09-22 DIAGNOSIS — E038 Other specified hypothyroidism: Secondary | ICD-10-CM

## 2024-09-22 MED ORDER — DEXTROMETHORPHAN POLISTIREX ER 30 MG/5ML PO SUER: 30.0000 mg | Freq: Every evening | ORAL | 0 refills | Status: AC | PRN

## 2024-09-22 MED ORDER — CEFDINIR 300 MG PO CAPS: 300.0000 mg | ORAL_CAPSULE | Freq: Two times a day (BID) | ORAL | 0 refills | Status: AC

## 2024-09-22 NOTE — Assessment & Plan Note (Signed)
-   Continue healthy diet and exercise as tolerated. - Continue medications as prescribed. - Check labs when fasting

## 2024-09-22 NOTE — Assessment & Plan Note (Signed)
-   Check labs when fasting. - Supplementation recommended based off lab results and will notify patient at that time  - Continue medications as prescribed.

## 2024-09-22 NOTE — Progress Notes (Signed)
 "  Established Patient Office Visit  Subjective:  Patient ID: [name], female    DOB: 12-Sep-1975  Age: 49 y.o. MRN: [id number]  Chief Complaint  Patient presents with   Nasal Congestion   Cough    Patient is here today for her 3 month follow up .  She has been feeling fairly well since last appointment. Patient lives in group home and is accompanied today by caregiver.  She does have additional concerns to discuss today.  Patient reports feeling sick. Reports her symptoms began 1 week ago. Endorses persistent cough at night and sinus congestion. Denies fever and chills. Sick exposure but reports negative covid and flu tests with the sick contacts. He reports feeling a little better but the cough is bother some. Caregiver reports she has been taking Mucinex but has not tried OTC cough medicine. Upon exam patient has left acute otitis media. Will send in cefdinir  and Delsym . Reinforced staying hydrated and rest. FU if symptoms persist of worsen. Recommend taking medication with food to avoid upset stomach.  Labs are due today; Patient is not fasting and will return for fasting labs early next week.  She does not need refills at this time.  I have reviewed her active problem list, medication list, allergies, family history, social history, health maintenance, notes from last encounter, lab results for her appointment today.   Patient has not gotten mammogram completed. Provided care giver the [facility] contact information to make an appointment.    No other concerns at this time.   Past Medical History:  Diagnosis Date   Anxiety    Hypertension    Panic disorder     Past Surgical History:  Procedure Laterality Date   COLONOSCOPY WITH PROPOFOL  N/A 04/21/2023   Procedure: COLONOSCOPY WITH PROPOFOL ;  Surgeon: Therisa Bi, MD;  Location: [facility] ENDOSCOPY;  Service: Gastroenterology;  Laterality: N/A;   ESOPHAGOGASTRODUODENOSCOPY (EGD) WITH PROPOFOL  N/A 04/21/2023   Procedure:  ESOPHAGOGASTRODUODENOSCOPY (EGD) WITH PROPOFOL ;  Surgeon: Therisa Bi, MD;  Location: [facility] ENDOSCOPY;  Service: Gastroenterology;  Laterality: N/A;   POLYPECTOMY  04/21/2023   Procedure: POLYPECTOMY;  Surgeon: Therisa Bi, MD;  Location: [facility] ENDOSCOPY;  Service: Gastroenterology;;    Social History   Socioeconomic History   Marital status: Single    Spouse name: Not on file   Number of children: Not on file   Years of education: Not on file   Highest education level: Not on file  Occupational History   Not on file  Tobacco Use   Smoking status: Every Day   Smokeless tobacco: Never  Vaping Use   Vaping status: Some Days   Substances: Nicotine, Flavoring  Substance and Sexual Activity   Alcohol use: No   Drug use: No   Sexual activity: Not Currently    Birth control/protection: Surgical    Comment: BTL  Other Topics Concern   Not on file  Social History Narrative   Not on file   Social Drivers of Health   Tobacco Use: High Risk (09/22/2024)   Patient History    Smoking Tobacco Use: Every Day    Smokeless Tobacco Use: Never    Passive Exposure: Not on file  Financial Resource Strain: Not on file  Food Insecurity: Not on file  Transportation Needs: Not on file  Physical Activity: Not on file  Stress: Not on file  Social Connections: Not on file  Intimate Partner Violence: Not on file  Depression (PHQ2-9): Low Risk (04/13/2024)   Depression (PHQ2-9)  PHQ-2 Score: 0  Alcohol Screen: Not on file  Housing: Not on file  Utilities: Not on file  Health Literacy: Not on file    History reviewed. No pertinent family history.  Allergies[1]  Review of Systems  Constitutional:  Negative for malaise/fatigue.  HENT:  Positive for congestion.   Eyes:  Negative for blurred vision and pain.  Respiratory:  Positive for cough. Negative for shortness of breath.   Cardiovascular:  Negative for chest pain, palpitations, claudication and leg swelling.  Gastrointestinal:  Negative  for abdominal pain, blood in stool, constipation, diarrhea, nausea and vomiting.  Genitourinary:  Negative for dysuria, frequency and urgency.  Musculoskeletal: Negative.   Skin: Negative.   Neurological:  Negative for dizziness, tingling, sensory change and headaches.  Endo/Heme/Allergies: Negative.   Psychiatric/Behavioral: Negative.         Objective:   BP 118/80   Pulse 89   Ht 5' 4 (1.626 m)   Wt 180 lb (81.6 kg)   SpO2 98%   BMI 30.90 kg/m   Vitals:   09/22/24 1505  BP: 118/80  Pulse: 89  Height: 5' 4 (1.626 m)  Weight: 180 lb (81.6 kg)  SpO2: 98%  BMI (Calculated): 30.88    Physical Exam Vitals and nursing note reviewed.  Constitutional:      Appearance: Normal appearance.  HENT:     Head: Normocephalic.     Right Ear: A middle ear effusion is present.     Left Ear: Tympanic membrane is injected.     Nose: Congestion present.     Right Turbinates: Enlarged and swollen.     Left Turbinates: Enlarged and swollen.     Mouth/Throat:     Pharynx: Postnasal drip present.  Eyes:     Extraocular Movements: Extraocular movements intact.     Pupils: Pupils are equal, round, and reactive to light.  Cardiovascular:     Rate and Rhythm: Normal rate and regular rhythm.     Pulses: Normal pulses.     Heart sounds: Normal heart sounds. No murmur heard. Pulmonary:     Effort: Pulmonary effort is normal. No respiratory distress.     Breath sounds: Normal breath sounds.  Abdominal:     General: There is no distension.     Tenderness: There is no abdominal tenderness.  Musculoskeletal:        General: No tenderness. Normal range of motion.     Cervical back: Normal range of motion and neck supple.     Right lower leg: No edema.     Left lower leg: No edema.  Skin:    General: Skin is warm and dry.     Coloration: Skin is not jaundiced.     Findings: No erythema.  Neurological:     General: No focal deficit present.     Mental Status: She is alert and oriented  to person, place, and time.  Psychiatric:        Mood and Affect: Mood normal.        Speech: Speech normal.        Behavior: Behavior is cooperative.        Cognition and Memory: Memory is not impaired.      No results found for any visits on 09/22/24.  No results found for this or any previous visit (from the past 2160 hours).     Assessment & Plan:   Assessment & Plan Essential hypertension, benign Mixed hyperlipidemia Prediabetes Gastroesophageal reflux disease without esophagitis Class  1 obesity due to excess calories with serious comorbidity and body mass index (BMI) of 30.0 to 30.9 in adult - Continue healthy diet and exercise as tolerated. - Continue medications as prescribed. - Check labs when fasting  Non-recurrent acute suppurative otitis media of left ear without spontaneous rupture of tympanic membrane Acute cough - Start cefdinir  as prescribed. - Start delsym  cough syrup as prescribed. - Recommend rest and hydration. FU if symptoms persist or worsen.  Bipolar affective disorder, currently depressed, moderate (HCC) Schizoaffective disorder, unspecified type (HCC) Other specified hypothyroidism - Check labs when fasting. - Supplementation recommended based off lab results and will notify patient at that time  - Continue medications as prescribed.    Return in about 3 months (around 12/20/2024), or if symptoms worsen or fail to improve.   Total time spent: 30 minutes  Oddis DELENA Cain, FNP  09/22/2024   This document may have been prepared by [facility] Voice Recognition software and as such may include unintentional dictation errors.     [1]  Allergies Allergen Reactions   Penicillins Nausea And Vomiting   "

## 2024-09-22 NOTE — Assessment & Plan Note (Signed)
-   Start cefdinir  as prescribed. - Start delsym  cough syrup as prescribed. - Recommend rest and hydration. FU if symptoms persist or worsen.

## 2024-12-22 ENCOUNTER — Ambulatory Visit: Payer: MEDICAID | Admitting: Internal Medicine
